# Patient Record
Sex: Male | Born: 1940 | Race: White | Hispanic: No | Marital: Married | Smoking: Current some day smoker
Health system: Southern US, Community
[De-identification: ages and names within clinical notes are randomized; demographics above are authoritative.]

## PROBLEM LIST (undated history)

## (undated) DIAGNOSIS — B9681 Helicobacter pylori [H. pylori] as the cause of diseases classified elsewhere: Secondary | ICD-10-CM

## (undated) DIAGNOSIS — K219 Gastro-esophageal reflux disease without esophagitis: Secondary | ICD-10-CM

## (undated) DIAGNOSIS — I10 Essential (primary) hypertension: Secondary | ICD-10-CM

## (undated) DIAGNOSIS — K429 Umbilical hernia without obstruction or gangrene: Principal | ICD-10-CM

## (undated) DIAGNOSIS — IMO0002 Reserved for concepts with insufficient information to code with codable children: Secondary | ICD-10-CM

## (undated) DIAGNOSIS — K648 Other hemorrhoids: Secondary | ICD-10-CM

## (undated) DIAGNOSIS — K5730 Diverticulosis of large intestine without perforation or abscess without bleeding: Secondary | ICD-10-CM

## (undated) DIAGNOSIS — R001 Bradycardia, unspecified: Secondary | ICD-10-CM

## (undated) DIAGNOSIS — E7800 Pure hypercholesterolemia, unspecified: Secondary | ICD-10-CM

## (undated) DIAGNOSIS — Z8679 Personal history of other diseases of the circulatory system: Secondary | ICD-10-CM

## (undated) DIAGNOSIS — K2970 Gastritis, unspecified, without bleeding: Secondary | ICD-10-CM

## (undated) DIAGNOSIS — Z860101 Personal history of adenomatous and serrated colon polyps: Secondary | ICD-10-CM

## (undated) DIAGNOSIS — Z86010 Personal history of colonic polyps: Secondary | ICD-10-CM

## (undated) DIAGNOSIS — K2270 Barrett's esophagus without dysplasia: Secondary | ICD-10-CM

## (undated) DIAGNOSIS — R079 Chest pain, unspecified: Secondary | ICD-10-CM

## (undated) DIAGNOSIS — K279 Peptic ulcer, site unspecified, unspecified as acute or chronic, without hemorrhage or perforation: Secondary | ICD-10-CM

## (undated) DIAGNOSIS — L719 Rosacea, unspecified: Secondary | ICD-10-CM

## (undated) DIAGNOSIS — I2510 Atherosclerotic heart disease of native coronary artery without angina pectoris: Secondary | ICD-10-CM

## (undated) DIAGNOSIS — E785 Hyperlipidemia, unspecified: Secondary | ICD-10-CM

## (undated) HISTORY — DX: Diverticulosis of large intestine without perforation or abscess without bleeding: K57.30

## (undated) HISTORY — DX: Bradycardia, unspecified: R00.1

## (undated) HISTORY — DX: Essential (primary) hypertension: I10

## (undated) HISTORY — DX: Hyperlipidemia, unspecified: E78.5

## (undated) HISTORY — DX: Helicobacter pylori (H. pylori) as the cause of diseases classified elsewhere: B96.81

## (undated) HISTORY — DX: Reserved for concepts with insufficient information to code with codable children: IMO0002

## (undated) HISTORY — PX: CAROTID STENT: SHX1301

## (undated) HISTORY — DX: Rosacea, unspecified: L71.9

## (undated) HISTORY — DX: Peptic ulcer, site unspecified, unspecified as acute or chronic, without hemorrhage or perforation: K27.9

## (undated) HISTORY — PX: COLONOSCOPY: SHX174

## (undated) HISTORY — DX: Personal history of adenomatous and serrated colon polyps: Z86.0101

## (undated) HISTORY — DX: Umbilical hernia without obstruction or gangrene: K42.9

## (undated) HISTORY — DX: Personal history of other diseases of the circulatory system: Z86.79

## (undated) HISTORY — DX: Pure hypercholesterolemia, unspecified: E78.00

## (undated) HISTORY — DX: Other hemorrhoids: K64.8

## (undated) HISTORY — PX: ANAL FISTULECTOMY: SHX1139

## (undated) HISTORY — DX: Gastro-esophageal reflux disease without esophagitis: K21.9

## (undated) HISTORY — DX: Gastritis, unspecified, without bleeding: K29.70

## (undated) HISTORY — DX: Chest pain, unspecified: R07.9

## (undated) HISTORY — DX: Barrett's esophagus without dysplasia: K22.70

## (undated) HISTORY — DX: Personal history of colonic polyps: Z86.010

---

## 1995-05-22 HISTORY — PX: KNEE ARTHROSCOPY: SHX127

## 1998-08-02 ENCOUNTER — Emergency Department (HOSPITAL_COMMUNITY): Admission: EM | Admit: 1998-08-02 | Discharge: 1998-08-02 | Payer: Self-pay | Admitting: Emergency Medicine

## 1998-08-02 ENCOUNTER — Encounter: Payer: Self-pay | Admitting: Emergency Medicine

## 1998-08-03 ENCOUNTER — Emergency Department (HOSPITAL_COMMUNITY): Admission: EM | Admit: 1998-08-03 | Discharge: 1998-08-03 | Payer: Self-pay | Admitting: Emergency Medicine

## 1999-06-27 ENCOUNTER — Encounter (INDEPENDENT_AMBULATORY_CARE_PROVIDER_SITE_OTHER): Payer: Self-pay

## 1999-06-27 ENCOUNTER — Ambulatory Visit (HOSPITAL_COMMUNITY): Admission: RE | Admit: 1999-06-27 | Discharge: 1999-06-27 | Payer: Self-pay | Admitting: *Deleted

## 1999-09-19 HISTORY — PX: MOLE REMOVAL: SHX2046

## 1999-09-23 DIAGNOSIS — E785 Hyperlipidemia, unspecified: Secondary | ICD-10-CM

## 1999-09-23 HISTORY — DX: Hyperlipidemia, unspecified: E78.5

## 2002-12-14 ENCOUNTER — Ambulatory Visit (HOSPITAL_COMMUNITY): Admission: RE | Admit: 2002-12-14 | Discharge: 2002-12-14 | Payer: Self-pay | Admitting: *Deleted

## 2002-12-14 ENCOUNTER — Encounter (INDEPENDENT_AMBULATORY_CARE_PROVIDER_SITE_OTHER): Payer: Self-pay

## 2004-11-15 ENCOUNTER — Emergency Department (HOSPITAL_COMMUNITY): Admission: EM | Admit: 2004-11-15 | Discharge: 2004-11-15 | Payer: Self-pay | Admitting: Emergency Medicine

## 2004-12-14 HISTORY — PX: CARDIAC CATHETERIZATION: SHX172

## 2004-12-19 ENCOUNTER — Ambulatory Visit (HOSPITAL_COMMUNITY): Admission: RE | Admit: 2004-12-19 | Discharge: 2004-12-20 | Payer: Self-pay | Admitting: Cardiology

## 2004-12-19 HISTORY — PX: CORONARY ANGIOPLASTY WITH STENT PLACEMENT: SHX49

## 2006-01-30 ENCOUNTER — Emergency Department (HOSPITAL_COMMUNITY): Admission: EM | Admit: 2006-01-30 | Discharge: 2006-01-30 | Payer: Self-pay | Admitting: Emergency Medicine

## 2006-01-31 ENCOUNTER — Ambulatory Visit (HOSPITAL_COMMUNITY): Admission: RE | Admit: 2006-01-31 | Discharge: 2006-01-31 | Payer: Self-pay | Admitting: Emergency Medicine

## 2006-02-04 ENCOUNTER — Encounter: Admission: RE | Admit: 2006-02-04 | Discharge: 2006-02-04 | Payer: Self-pay | Admitting: Internal Medicine

## 2006-02-27 ENCOUNTER — Encounter (INDEPENDENT_AMBULATORY_CARE_PROVIDER_SITE_OTHER): Payer: Self-pay | Admitting: *Deleted

## 2006-02-27 ENCOUNTER — Ambulatory Visit (HOSPITAL_COMMUNITY): Admission: RE | Admit: 2006-02-27 | Discharge: 2006-02-27 | Payer: Self-pay | Admitting: *Deleted

## 2006-05-21 DIAGNOSIS — IMO0002 Reserved for concepts with insufficient information to code with codable children: Secondary | ICD-10-CM

## 2006-05-21 HISTORY — DX: Reserved for concepts with insufficient information to code with codable children: IMO0002

## 2006-05-28 ENCOUNTER — Ambulatory Visit (HOSPITAL_COMMUNITY): Admission: RE | Admit: 2006-05-28 | Discharge: 2006-05-28 | Payer: Self-pay | Admitting: *Deleted

## 2006-05-28 ENCOUNTER — Encounter (INDEPENDENT_AMBULATORY_CARE_PROVIDER_SITE_OTHER): Payer: Self-pay | Admitting: Specialist

## 2007-08-26 ENCOUNTER — Encounter (INDEPENDENT_AMBULATORY_CARE_PROVIDER_SITE_OTHER): Payer: Self-pay | Admitting: *Deleted

## 2007-08-26 ENCOUNTER — Ambulatory Visit (HOSPITAL_COMMUNITY): Admission: RE | Admit: 2007-08-26 | Discharge: 2007-08-26 | Payer: Self-pay | Admitting: *Deleted

## 2008-04-09 IMAGING — CT CT PELVIS W/O CM
2 of 4 series · 14 of 32 positions shown, 19 images · IV contrast (agent unspecified)
Comparison: No prior CT.  This exam is correlated with an ultrasound on 01/31/06.

CLINICAL DATA: Back pain/recent ultrasound showed possible right hydronephrosis and multiple renal cysts. 
 ABDOMEN CT WITHOUT CONTRAST:
TECHNIQUE: Multidetector CT imaging of the abdomen was performed following the standard protocol without oral or IV contrast.
TECHNIQUE: Multidetector CT imaging of the pelvis was performed following the standard protocol without IV contrast.

[Series 2: renal stone · axial · 0.70mm/px · z∈[-351,-86]mm · 6 of 75 slices shown, 11 images]
[im 11/75  soft-tissue]
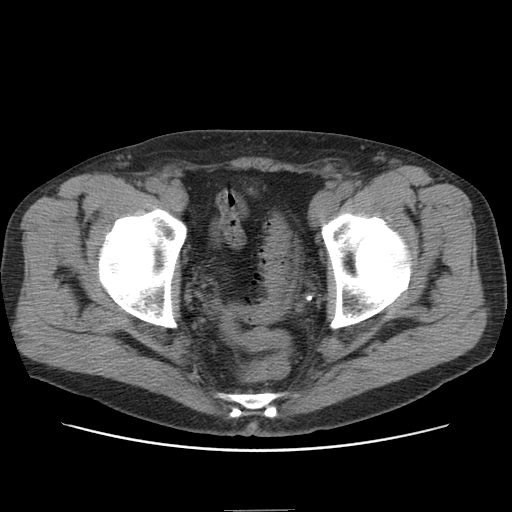
[im 11/75  bone]
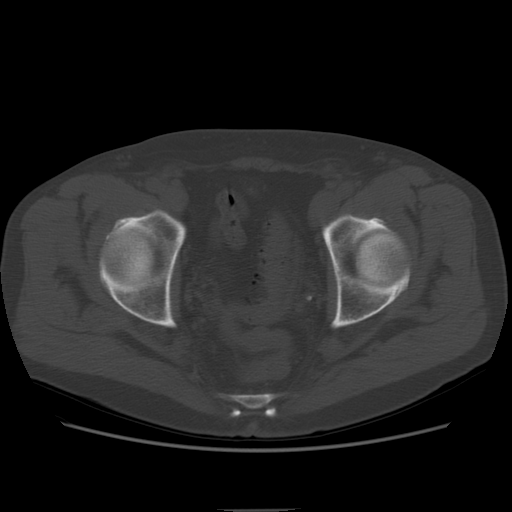
[im 22/75  soft-tissue]
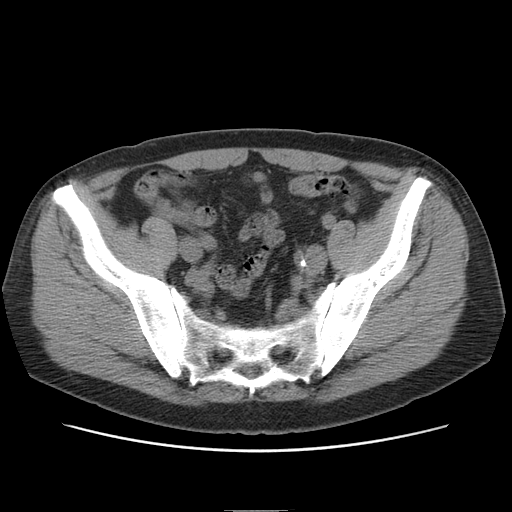
[im 32/75  soft-tissue]
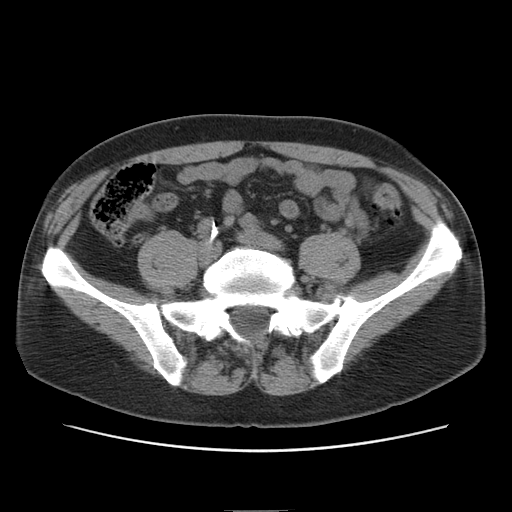
[im 32/75  lung]
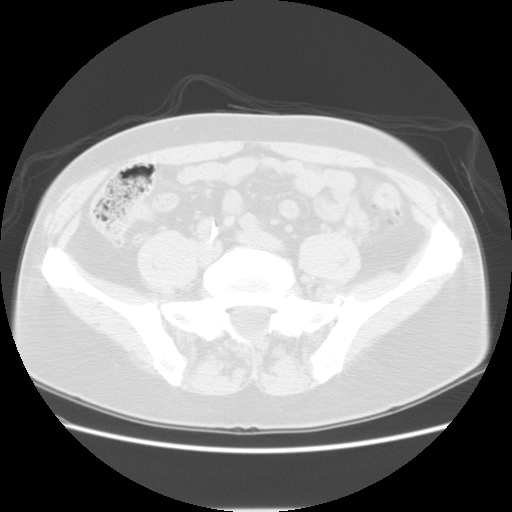
[im 43/75  soft-tissue]
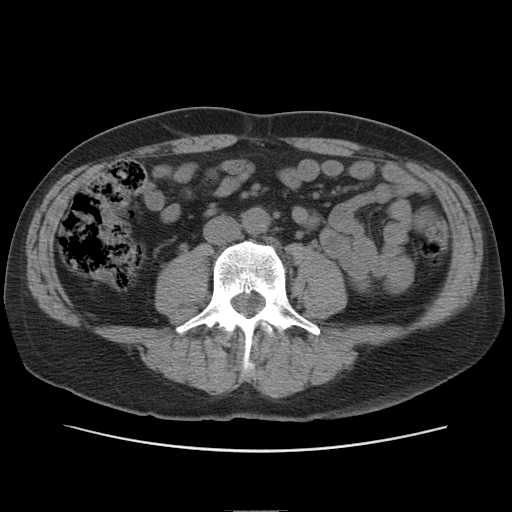
[im 43/75  lung]
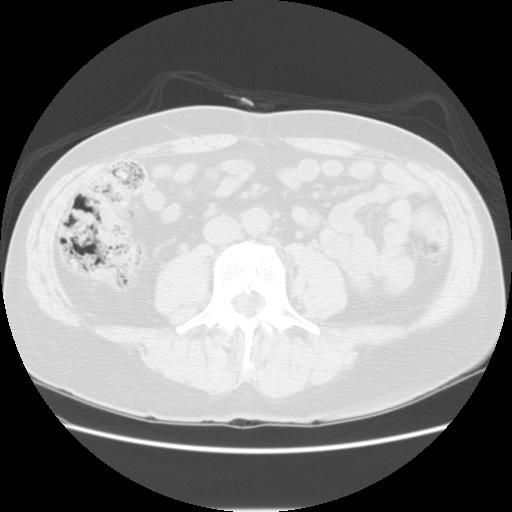
[im 53/75  soft-tissue]
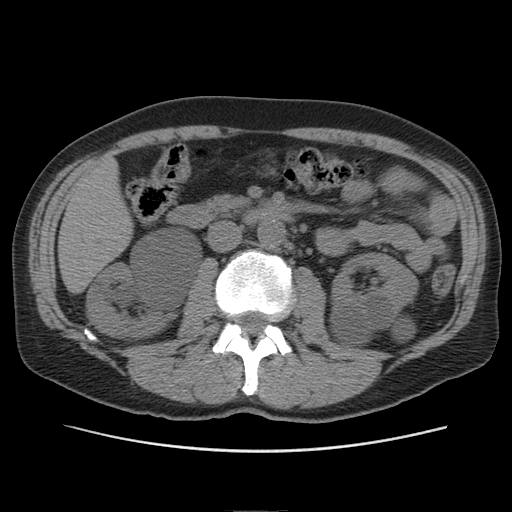
[im 53/75  lung]
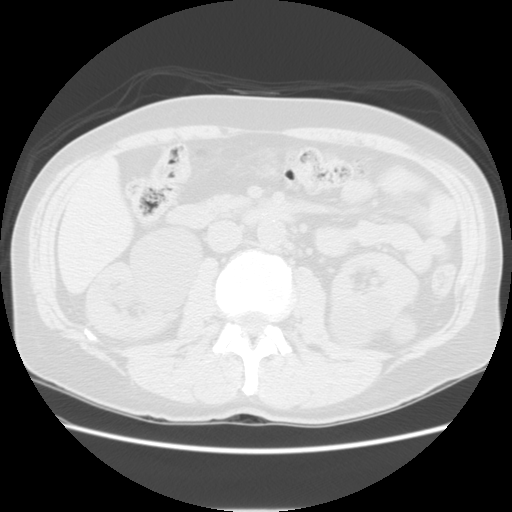
[im 64/75  soft-tissue]
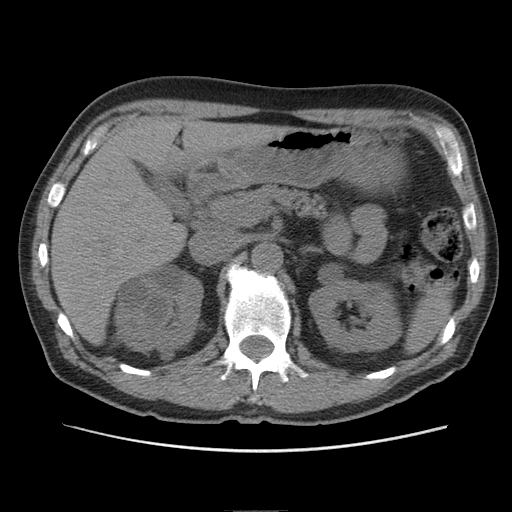
[im 64/75  lung]
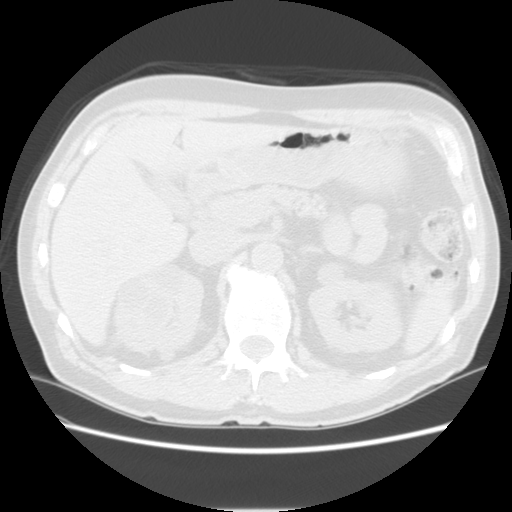

[Series 103: reformatted · sagittal · 0.74mm/px · 8 of 129 slices shown]
[im 10/129  soft-tissue]
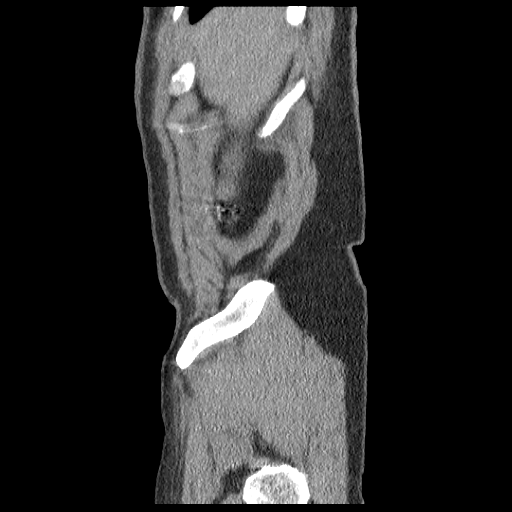
[im 30/129  soft-tissue]
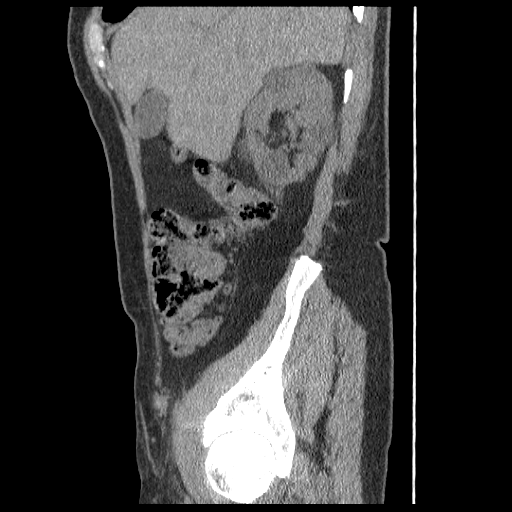
[im 40/129  soft-tissue]
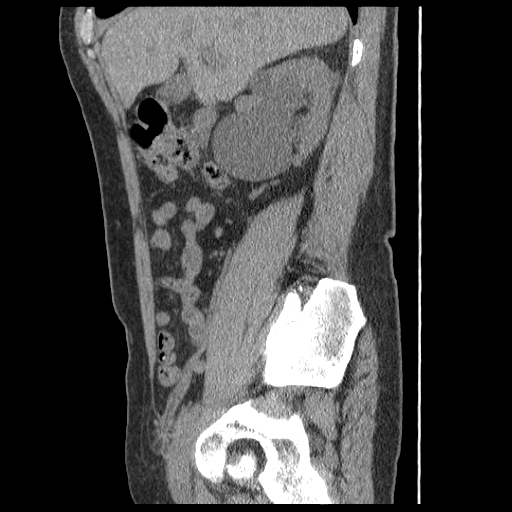
[im 60/129  soft-tissue]
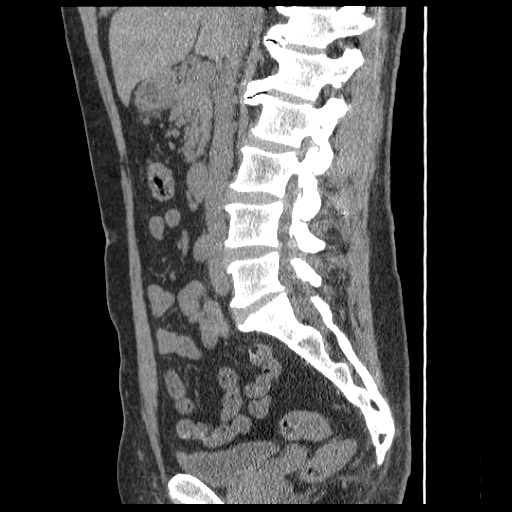
[im 69/129  soft-tissue]
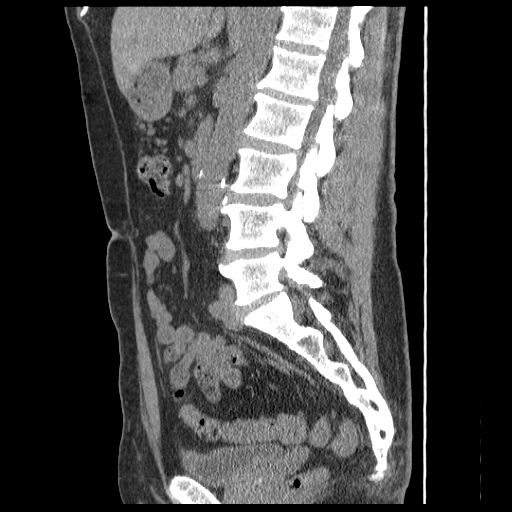
[im 89/129  soft-tissue]
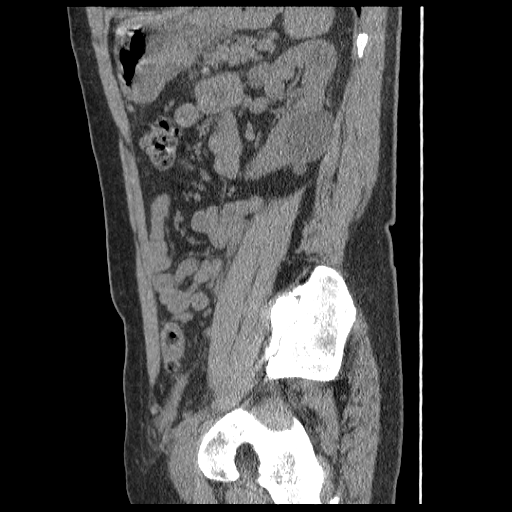
[im 99/129  soft-tissue]
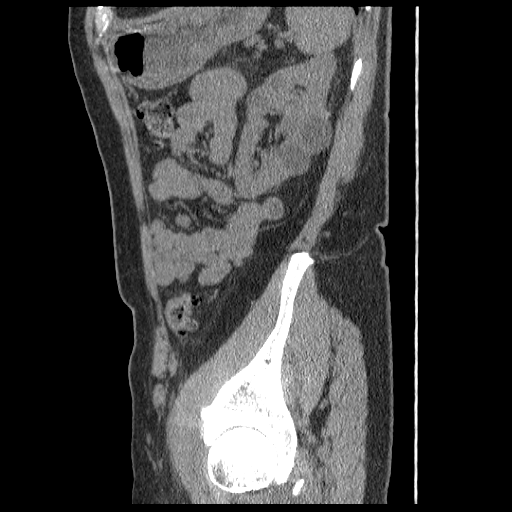
[im 119/129  soft-tissue]
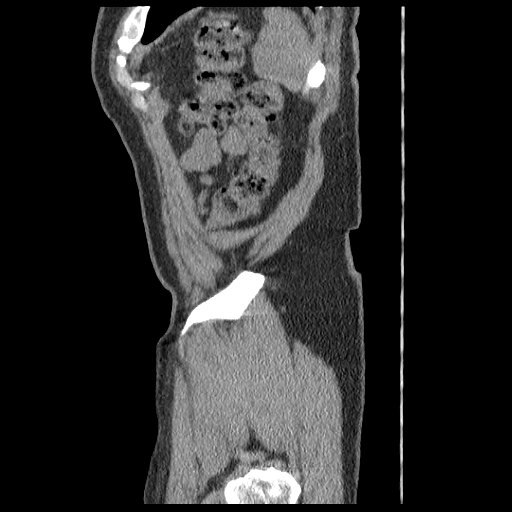

[14 of 32 positions shown; findings below may reference images not displayed]

FINDINGS: As was noted on the ultrasound, there are scattered small liver cysts. 
 There are multiple bilateral renal cysts, most of which are at or near water in density.  The 4.8 cm cyst in the lower pole of the left kidney which looked somewhat complex on ultrasound, has Hounsfield units of 5, indicating that it is a simple cyst.  There is a single small hyperdense cyst in the periphery of the lower pole of the left kidney which is benign. 
 Other viscera unremarkable in the unenhanced state.  No calcified gallstones.  There is aortic calcification without aneurysm.
IMPRESSION: 1.  Multiple bilateral renal cysts.  Favor large peripelvic right renal cyst versus dilated extrarenal pelvis due to congenital UPJ obstruction.  See note.
 2.  Multiple small liver cysts.
 3.  No other findings of significance except for some aortic calcification.
 PELVIS CT WITHOUT CONTRAST:
FINDINGS: No focal masses, adenopathy or fluid collections.  The prostate is enlarged and there is sigmoid diverticulosis. 
 No ureteral stones or dilatation.
IMPRESSION: Sigmoid diverticulosis and prostatic enlargement.  No acute findings.

## 2008-06-17 ENCOUNTER — Ambulatory Visit (HOSPITAL_COMMUNITY): Admission: RE | Admit: 2008-06-17 | Discharge: 2008-06-17 | Payer: Self-pay | Admitting: *Deleted

## 2008-09-22 ENCOUNTER — Encounter: Admission: RE | Admit: 2008-09-22 | Discharge: 2008-09-22 | Payer: Self-pay | Admitting: Cardiovascular Disease

## 2008-09-23 ENCOUNTER — Inpatient Hospital Stay (HOSPITAL_COMMUNITY): Admission: AD | Admit: 2008-09-23 | Discharge: 2008-09-24 | Payer: Self-pay | Admitting: Cardiovascular Disease

## 2008-09-23 HISTORY — PX: CARDIAC CATHETERIZATION: SHX172

## 2008-09-23 HISTORY — PX: CORONARY ANGIOPLASTY WITH STENT PLACEMENT: SHX49

## 2010-08-29 LAB — CBC
MCHC: 34.9 g/dL (ref 30.0–36.0)
MCV: 99.3 fL (ref 78.0–100.0)
Platelets: 175 10*3/uL (ref 150–400)

## 2010-08-29 LAB — CARDIAC PANEL(CRET KIN+CKTOT+MB+TROPI)
CK, MB: 1.3 ng/mL (ref 0.3–4.0)
Relative Index: INVALID (ref 0.0–2.5)
Relative Index: INVALID (ref 0.0–2.5)
Total CK: 33 U/L (ref 7–232)
Troponin I: 0.03 ng/mL (ref 0.00–0.06)
Troponin I: 0.03 ng/mL (ref 0.00–0.06)

## 2010-08-29 LAB — BASIC METABOLIC PANEL
Calcium: 9.1 mg/dL (ref 8.4–10.5)
Chloride: 111 mEq/L (ref 96–112)
Creatinine, Ser: 1.02 mg/dL (ref 0.4–1.5)
GFR calc Af Amer: >60 mL/min (ref >60)
Sodium: 143 mEq/L (ref 135–145)

## 2010-09-25 ENCOUNTER — Other Ambulatory Visit: Payer: Self-pay | Admitting: Gastroenterology

## 2010-10-03 NOTE — Discharge Summary (Signed)
NAME:  Jonathan Bullock, Jonathan Bullock NO.:  [id number]   MEDICAL RECORD NO.:  [id number]          PATIENT TYPE:  INP   LOCATION:  2504                         FACILITY:  [facility]   PHYSICIAN:  Nanetta Batty, M.D.   DATE OF BIRTH:  24-Aug-1940   DATE OF ADMISSION:  09/23/2008  DATE OF DISCHARGE:  09/24/2008                               DISCHARGE SUMMARY   DISCHARGE DIAGNOSES:  1. Chest pain consistent with unstable angina.  2. Known coronary artery disease with prior left anterior descending      stenting.  3. Treated hypertension.  4. Treated dyslipidemia.  5. Abnormal Myoview as an outpatient.   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  He was seen by Dr. Allyson Sabal on Sep 22, 2008 after an  abnormal Myoview as an outpatient that was ordered by his primary MD.  He does have a history of prior coronary artery disease and had had  previous LAD stents placed in 2006.  He was having chest pain consistent  with angina.  Please see Dr. Hazle Coca note for complete details.  The  patient was admitted for diagnostic catheterization on Sep 23, 2008.  He  had a patent proximal LAD stent, but a 95% LAD lesion just after the  stent.  This was dilated and stented with his IM stent.  Left main  circumflex and RCA within normal limits and his EF was normal.  Dr.  Allyson Sabal feels he can be discharged on Sep 24, 2008.   LABS AT DISCHARGE:  White count 5.8, hemoglobin 14.1, hematocrit 40.5,  platelets 175.  Sodium 143, potassium 4.2, BUN 11, creatinine 1,  troponins were negative x2.   DISCHARGE MEDICATIONS:  1. Acebutolol 200 mg b.i.d.  2. Crestor 10 mg at bedtime.  3. Plavix 75 mg a day.  4. Aspirin 81 mg a day.  5. Ranitidine 150 mg a day.  6. Isosorbide 60 mg a day.  7. Nitroglycerin sublingual p.r.n.   DISPOSITION:  The patient is discharged in stable condition.  Dr. Allyson Sabal  will see him next week in the office.     Abelino Derrick, P.A.      Nanetta Batty, M.D.  Electronically Signed   LKK/MEDQ  D:  09/24/2008  T:  09/24/2008  Job:  [id number]

## 2010-10-03 NOTE — Op Note (Signed)
NAME:  Jonathan Bullock, Jonathan Bullock NO.:  [id number]   MEDICAL RECORD NO.:  [id number]          PATIENT TYPE:  AMB   LOCATION:  ENDO                         FACILITY:  [facility]   PHYSICIAN:  Georgiana Spinner, M.D.    DATE OF BIRTH:  1940/08/04   DATE OF PROCEDURE:  DATE OF DISCHARGE:                               OPERATIVE REPORT   PROCEDURE:  Upper endoscopy.   INDICATIONS:  GERD.   ANESTHESIA:  Demerol 70 mg, Versed 7.5 mg.   PROCEDURE:  With the patient mildly sedated in the left lateral  decubitus position, the Pentax videoscopic endoscope was inserted in the  mouth and passed under direct vision through the esophagus, which  appeared normal until we reached the distal esophagus and there appeared  to be small areas of Barrett's photographed and biopsied.  We entered  into the stomach.  Fundus, body, antrum, duodenal bulb, and second  portion of the duodenum were visualized.  From this point the endoscope  was slowly withdrawn, taking circumferential views of duodenal mucosa  until the endoscope been pulled back into stomach, placed in  retroflexion to view the stomach from below.  The endoscope was  straightened and withdrawn, taking circumferential views of remaining  gastric and esophageal mucosa.  The patient's vital signs, pulse  oximeter remained stable.  The patient tolerated the procedure well  without apparent complications.   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  Await biopsy  reports.  The patient will call me for results and follow up with me as  needed as an outpatient.           ______________________________  Georgiana Spinner, M.D.     GMO/MEDQ  D:  08/26/2007  T:  08/26/2007  Job:  [id number]

## 2010-10-03 NOTE — Cardiovascular Report (Signed)
NAME:  RICE, WALSH NO.:  [id number]   MEDICAL RECORD NO.:  [id number]          PATIENT TYPE:  INP   LOCATION:  2504                         FACILITY:  [facility]   PHYSICIAN:  Nanetta Batty, M.D.   DATE OF BIRTH:  [birth year]   DATE OF PROCEDURE:  09/23/2008  DATE OF DISCHARGE:                            CARDIAC CATHETERIZATION   Cardiac catheterization/PCI and stent.   Mr. Popowski is a 70 year old married white male father of 2, grandfather  to 4 grandchildren, who works as a Clinical research associate in the firm [employer].  He has known CAD status post stenting of his mid LAD by Dr. Alanda Amass,  December 19, 2004, with overlapping of Cypher drug-eluting stents.  His  other problems include hypertension and hyperlipidemia.  He has had  recent chest pain, and a Myoview performed in our office yesterday  showed inferoapical ischemia.  He presents now for diagnostic coronary  arteriography to define his anatomy and rule out an ischemic etiology.   PROCEDURE DESCRIPTION:  The patient was brought to the second floor of   Cardiac Cath Lab in the postabsorptive state.  He was  premedicated with p.o. Valium, IV Versed and fentanyl.  His right groin  was prepped and shaved in the usual sterile fashion.  Xylocaine 1% was  used for local anesthesia.  A 6-French sheath was inserted into the  right femoral artery using standard Seldinger technique.  A 6-French  right and left Judkins diagnostic catheter as well as a 6-French pigtail  catheter were used for selective coronary angiography, left  ventriculography respectively.  Visipaque dye was used for the entirety  of the case.  Retrograde aortic, left ventricular, and pullback  pressures were recorded.   HEMODYNAMICS:  1. Aortic systolic pressure 121, diastolic pressure 60.  2. Left ventricular systolic pressure 118, end-diastolic pressure 3.  3. Selective cholangiography:      a.     Left main normal.      b.     LAD; the LAD  stents were patent.  It was a focal 95% lesion       just distal to the distal stent in the proximal LAD.      c.     Circumflex; nondominant and free of significant disease.      d.     Right coronary; dominant and free of significant disease.      e.     Left ventriculography; RAO and left ventriculogram was       performed using 25 mL of Visipaque dye at 12 mL per second.  The       overall LVEF was estimated greater than 60% without focal wall       motion abnormalities.   IMPRESSION:  Mr. Eliot has new lesion just distal to the previously  placed stent in the mid left anterior descending responsible for his  symptoms and Myoview abnormality.  We will proceed with percutaneous  coronary intervention and stenting using Angiomax and drug-eluting  stent.   The patient received 4 baby aspirin, an additional 300 mg of p.o.  Plavix.  He was on a 75 mg a day maintenance dose and did take his  morning dose this morning.  He received Angiomax bolus with an ACT over  300.   Using a 6-French LAD XB 3.5 catheter along with 0.014, 190 Asahi soft  wire and a 2.0 x 12  Apex, angioplasty was performed at nominal  pressures across the lesion.  Intracoronary nitroglycerin 200 mcg was  then administered.  A 3.0 x 12 XIENCE V drug-eluting stent was then  deployed at 12 atmospheres and postdilated with a 3.25 x 15 Milnor Quantum  Apex at 16 atmospheres (3.3 mm), resulting in reduction of a 95% mid LAD  lesion to 0% residual without dissection.  The patient tolerated the  procedure well without hemodynamic or electrocardiographic sequelae.  Successful PCI and stenting of mid LAD using XIENCE V drug-eluting stent  and Angiomax.  The guidewire and catheter were removed.  The sheath was  sewn securely in place.  The patient left the lab in stable condition.  He will be hydrated for 6 hours, discharged home in the morning on  aspirin and Plavix and will see Dr. Charolette Child back in the office in   followup.       Nanetta Batty, M.D.  Electronically Signed     JB/MEDQ  D:  09/23/2008  T:  09/24/2008  Job:  [id number]   cc:   [facility].  Antionette Char, MD

## 2010-10-03 NOTE — Op Note (Signed)
NAME:  JEVAUN, STRICK NO.:  [id number]   MEDICAL RECORD NO.:  [id number]          PATIENT TYPE:  AMB   LOCATION:  ENDO                         [facility]ACILITY:  [facility]   PHYSICIAN:  Georgiana Spinner, M.D.    DATE OF BIRTH:  1940-12-21   DATE OF PROCEDURE:  DATE OF DISCHARGE:                               OPERATIVE REPORT   PROCEDURE:  Colonoscopy.   INDICATIONS:  Colon cancer screening.   ANESTHESIA:  Demerol 70 mg, Versed 7.5 mg.   DESCRIPTION OF PROCEDURE:  With the patient mildly sedated in the left  lateral decubitus position a rectal exam was performed which was  unremarkable.  Subsequently, the Pentax videoscopic pediatric  colonoscope was inserted in the rectum and passed under direct vision to  the cecum, identified by ileocecal valve and appendiceal orifice, both  of which were photographed.  From this point, the colonoscope was slowly  withdrawn taking circumferential views of colonic mucosa, stopping in  the rectum which appeared normal on direct and showed hemorrhoids on  retroflexed view.  The endoscope was straightened and withdrawn.  The  patient's vital signs and pulse oximeter remained stable.  The patient  tolerated the procedure well without apparent complications.   FINDINGS:  Diverticulosis of sigmoid colon, mild internal hemorrhoids,  otherwise unremarkable exam.   PLAN:  Consider repeat examination in 5-10 years.           ______________________________  Georgiana Spinner, M.D.     GMO/MEDQ  D:  06/17/2008  T:  06/17/2008  Job:  [id number]

## 2010-10-06 NOTE — Cardiovascular Report (Signed)
NAME:  [name], [name] NO.:  [id number]   MEDICAL RECORD NO.:  [id number]          PATIENT TYPE:  OIB   LOCATION:  6529                         FACILITY:  [facility]   PHYSICIAN:  Richard A. Alanda Amass, M.D.DATE OF BIRTH:  [birth year]   DATE OF PROCEDURE:  12/19/2004  DATE OF DISCHARGE:                              CARDIAC CATHETERIZATION   PROCEDURES:  Retrograde central aortic catheterization, selective left  coronary angiography pre and post IC nitroglycerin administration, weight  adjusted heparin, Aggrastat bolus plus infusion, double wire LAD-DX2, PCI  with cutting balloon atherectomy ostium DX2, and DES 3.5/13 Cypher stenting  proximal LAD across DX2, tandem 3.0/13 mid LAD DES stent post dilated with  3.45 balloon, DX2 2.5 ostial balloon POBA through stent struts, Aggrastat  bolus plus infusion, additional continue aspirin, Plavix 300 milligrams  p.o., weight-adjusted heparin, IC nitroglycerin administration, pre and post  LAD PCI IVUS interrogation LAD.   BRIEF HISTORY:  Mr. Jonathan Bullock is a married 70 year old father of two  with one grandchild noncigarette smoker, occasional cigars, practicing real  estate lawyer, under the care of Dr. Renne Crigler and Dr. Aleen Campi. He had episode  of ischemic chest pain recently prompting outpatient treadmill exercise  which was positive for ischemia (December 07, 2004). Outpatient catheterization  by Dr. Aleen Campi, December 14, 2004, revealed high-grade LAD stenosis across the  second diagonal, ostial DX to stenosis and tandem proximal-mid LAD stenosis.  Well-preserved LV function. No significant right or circumflex disease. He  was referred for PCI in this setting.   PROCEDURE:  Risks were fully explained to the patient and his family and  informed consent was obtained to proceed with angiography and probable PCI.   He was taken to the second floor CP lab in the postabsorptive state after  all premedication. He was given 300 milligrams  of Plavix prior to the  procedure and additional 150 milligrams during the procedure. Weight-  adjusted heparin and Aggrastat bolus plus infusion were administered. The  right groin was prepped, draped in the usual manner. 1% Xylocaine was used  for local anesthesia and CRFA was entered with single anterior puncture  using an 18 thin-wall needle. A 7-French short Daig sidearm sheath was  inserted anticipating the possible need for a 7-French catheter because of  double wire and possibly kissing balloon technique. The left coronary was  intubated with a JL-4, 7-French guiding catheter. Weight-adjusted heparin  and Aggrastat bolus plus infusion was administered. A 0.014 inch Asahi soft  wire was then used to cross the tandem LAD stenosis and was free in the  distal LAD. A second 0.014 inches Asahi light wire was used to cross into  the second diagonal branch. The patient was given intermittent Versed and  fentanyl for sedation in the laboratory IV. ACTs were monitored and remained  therapeutic. An Atlantis Pro IVUS this was then passed across the LAD  lesions and pullback IVUS interrogation was performed after initial  angiography pre and post IC nitroglycerin. The stenosis appeared slightly  less than on his preoperative shots and it was felt best to confirm these  lesions as well assess his vessel sizes.   The distal reference vessel measured 3.1 x 3.5 mm with eccentric  calcification and plaque. There was approximately 60% narrowing in the area  in the second tandem lesion beyond the DX2.   The LAD lesion just proximal to and across the DX2 measured 1.9 x 2.4 mm  with significant soft plaque and eccentric calcification. The proximal  reference in the large portion of the proximal LAD measured 4.3 x 3.8 mm.  The patient tolerated IVUS interrogation well.   The diagonal wire was then crossed with a 2.5/10 cutting balloon and the  ostium was dilated at 5-43, 6-31 at 6 atmospheres for 44  seconds with good  angiographic result. This was then exchanged for 3.5/13 Cypher stent. The  LAD lesion was covered across the DX2, positioned fluoroscopically just  beyond the large portion of the proximal LAD and deployed at 10-25, post  dilated at 20 atmospheres for 56 seconds. There was pinching of the DX2  ostium. The guide wire had been pulled back prior to stent deployment and  then the DX2 was then recrossed through the stent struts with a same 0.014  inches guidewire. The ostium of the DX2 was dilated with a 2.5/08 Voyager  balloon at 5-24 and 5-36. The balloon was then pulled back. The tandem  second LAD lesion was then primarily stented with an overlapping 3.0/13  Cypher stent which was the beyond the DX2 which deployed at 20-56 and post  dilated at 18 atmospheres for 40 seconds. IVUS interrogation was then  performed and this showed slight under expansion of the second stent and  considerable soft plaque behind it. There was good expansion of the first  LAD stent. The overlap and tandem LAD stent was then dilated with a upgraded  3.5/8 Quantum Maverick balloon at 10-26 and 15 atmospheres for 30 seconds.  The balloon was pulled back and final injection showed excellent  angiographic result. The side branch DX2 was intact with stenosis reduced  from 75% to 0%.   The LAD stenosis proximal across the DX2 was reduced from 75% 0%. The tandem  second LAD lesion beyond the DX2 was reduced from 60 to 0%. There was TIMI  III flow throughout the LAD diagonal. The diagonal ostium was reduced from  approximately 85% to less than 40% with some residual ostial narrowing but  good flow.   Dilatation system was removed. Side-arm sheath was flushed and secured to  the skin with #1 silk suture to prevent migration. The patient was  transferred to holding area for postoperative care. He will be continued on  Aggrastat for 18 hours, continued on aspirin and Plavix. He will be under the  cardiology care of Dr. Charolette Child and medical care of Dr. Merri Brunette.   CATHETER DIAGNOSES:  1.  Arteriosclerotic heart disease, recent onset angina.  2.  Positive treadmill exercise test.  3.  Successful percutaneous coronary intervention.      1.  Drug-eluting stent proximal-mid left anterior descending artery          across distal circumflex-2.      2.  Tandem drug-eluting stenting midleft anterior descending artery          beyond the distal circumflex-2.      3.  Ostial distal circumflex-2 cutting balloon atherectomy and upgraded          2.5 balloon dilatation successful.  4.  Hyperlipidemia.  5.  Nicotine abuse, cigars.  6.  Active rosacea.  7.  Hyperlipidemia.  8.  Systemic hypertension.      Richard A. Alanda Amass, M.D.  Electronically Signed     RAW/MEDQ  D:  12/19/2004  T:  12/19/2004  Job:  [id number]   cc:   Patient's chart   Antionette Char, MD  [address]  [place]  [place] [address]  Fax: [phone]   Soyla Murphy. Renne Crigler, M.D.  [address]  [place]  [place] [address]  Fax: [phone]

## 2010-10-06 NOTE — Op Note (Signed)
NAME:  Jonathan Bullock, HERMIZ NO.:  [id number]   MEDICAL RECORD NO.:  [id number]          PATIENT TYPE:  AMB   LOCATION:  ENDO                         FACILITY:  [facility]   PHYSICIAN:  Georgiana Spinner, M.D.    DATE OF BIRTH:  01-26-1941   DATE OF PROCEDURE:  02/27/2006  DATE OF DISCHARGE:                                 OPERATIVE REPORT   PROCEDURE:  Upper endoscopy.   INDICATIONS:  Abdominal pain with radiation to the back.   ANESTHESIA:  1. Demerol 70 mg.  2. Versed 7 mg.   PROCEDURE:  With the patient mildly sedated in the left lateral decubitus  position, the Olympus videoscopic endoscope was inserted in the mouth,  passed under direct vision into the esophagus which appeared normal.  There  was no evidence of Barrett's esophagus.  Small hiatal hernia was noted and  photographed.  We entered into the stomach.  Fundus, body, antrum, duodenal  bulb, second portion of duodenum were visualized.  From this point, the  endoscope was slowly withdrawn, taking circumferential views of duodenal  mucosa until the endoscope had been pulled back into the stomach, placed in  retroflexion to view the stomach from below.  The endoscope was then  straightened and withdrawn taking circumferential views of the remaining  gastric and esophageal mucosa stopping to photograph along the way and then  stopping and as we viewed the posterior wall of the stomach and the fundus  approximately 10 cm from the squamocolumnar junction at which point a heaped  up with an ulcer in it was seen.  It was photographed, and it was biopsied  with multiple large bites taken around the ulcer base.  We also then moved  back to the body of the stomach where some mild erythema was seen and  biopsied to sample for H. pylori.  Once accomplished, the endoscope was  withdrawn, taking circumferential views of the remaining gastric and  esophageal mucosa.  The patient's vital signs, pulse oximeter remained  stable.   The patient tolerated the procedure well without apparent  complications.   FINDINGS:  Ulcer in a large heaped up area posteriorly in the fundus 10 cm  from the squamocolumnar junction and question of gastritis.  Biopsies of  both areas taken.   PLAN:  Await biopsy report.  The patient will call me for results and follow  up with me as an outpatient.  Continue present therapy.           ______________________________  Georgiana Spinner, M.D.     GMO/MEDQ  D:  02/27/2006  T:  02/28/2006  Job:  [id number]

## 2010-10-06 NOTE — Op Note (Signed)
   NAME:  Jonathan Bullock, Jonathan Bullock                           ACCOUNT NO.:  [id number]   MEDICAL RECORD NO.:  [id number]                   PATIENT TYPE:  AMB   LOCATION:  ENDO                                 FACILITY:  [facility]   PHYSICIAN:  Georgiana Spinner, M.D.                 DATE OF BIRTH:  1941/02/09   DATE OF PROCEDURE:  DATE OF DISCHARGE:                                 OPERATIVE REPORT   PROCEDURE:  Colonoscopy with follow up polypectomy and biopsy.   ENDOSCOPIST:  Georgiana Spinner, M.D.   INDICATIONS:  Rectal bleeding.   ANESTHESIA:  Demerol 75, Versed 8 mg.   DESCRIPTION OF PROCEDURE:  With the patient mildly sedated in the left  lateral decubitus position, the Olympus videoscopic coloscope was inserted  into the rectum and passed under direct vision into the cecum, identified by  ileocecal valve and appendiceal orifice both of which were photographed.   From this point the colonoscope was slowly withdrawn taking circumferential  views of the entire colonic mucosa stopping only to photograph seen along  the way until we reached the rectum which appeared normal on direct view;  and showed 2 small polyps on retroflex view, both of which were removed.  One using snare cautery technique, the other with hot biopsy forceps  technique.  The tissues was retrieved for pathology.  The patient's vital  signs and pulse oximetry remained stable.  The patient tolerated the  procedure well without apparent complications.   FINDINGS:  Polyps of the rectum seen only on retroflex view with mild  diverticulosis of the sigmoid colon, otherwise unremarkable colonoscopic  examination.   PLAN:  Some of the tissue from the slightly larger polyp was lost due to  cautery and artifact; therefore, will repeat examination in 3 years  regardless of results of biopsy report because the family history is  positive in this patient.                                               Georgiana Spinner, M.D.    GMO/MEDQ  D:   12/14/2002  T:  12/14/2002  Job:  [id number]

## 2010-10-06 NOTE — Op Note (Signed)
NAME:  Jonathan Bullock, Jonathan Bullock NO.:  [id number]   MEDICAL RECORD NO.:  [id number]          PATIENT TYPE:  AMB   LOCATION:  ENDO                         FACILITY:  [facility]   PHYSICIAN:  Georgiana Spinner, M.D.    DATE OF BIRTH:  14-Apr-1941   DATE OF PROCEDURE:  05/28/2006  DATE OF DISCHARGE:                               OPERATIVE REPORT   PROCEDURE:  Upper endoscopy.   INDICATIONS:  Follow-up of gastric ulcer.   ANESTHESIA:  Fentanyl 60 mcg and Versed 6 mg.   PROCEDURE:  With the patient mildly sedated in the left lateral  decubitus position, the Pentax videoscopic endoscope was inserted in the  mouth and passed under direct vision through the esophagus, which  appeared normal until we reached the distal esophagus and there was a  question of Barrett's seen at this time.  I photographed this area and  took biopsy of this area, entered into the stomach subsequently.  Fundus, body and antrum appeared normal.  There was no evidence of  previously noted ulcer.  Duodenal bulb and second portion duodenum  appeared normal.  From this point, the endoscope was slowly withdrawn  taking circumferential views of duodenal mucosa until the endoscope had  been pulled back into the stomach and placed in retroflexion to view the  stomach from below.  The endoscope was straightened and withdrawn taking  circumferential views of the remaining gastric and esophageal mucosa  stopping to biopsy antral mucosa to verify the H. pylori eradication.  The patient's vital signs and pulse oximeter remained stable.  The  patient tolerated the procedure well without apparent complication.   FINDINGS:  Question of Barrett's esophagus but otherwise an unremarkable  examination with biopsies taken of the distal esophagus and of the  antrum, as noted above.   PLAN:  Await biopsy report.  The patient will call me for results and  follow-up with me as needed.           ______________________________  Georgiana Spinner, M.D.     GMO/MEDQ  D:  05/28/2006  T:  05/28/2006  Job:  [id number]

## 2010-10-06 NOTE — H&P (Signed)
NAMEMarland Kitchen  NAVEEN, CLARDY NO.:  [id number]   MEDICAL RECORD NO.:  [id number]          PATIENT TYPE:  OIB   LOCATION:  2899                         FACILITY:  [facility]   PHYSICIAN:  Richard A. Alanda Amass, M.D.DATE OF BIRTH:  Oct 04, 1940   DATE OF ADMISSION:  12/19/2004  DATE OF DISCHARGE:                                HISTORY & PHYSICAL   REASON FOR ADMISSION:  Intervention to the LAD.   HISTORY OF PRESENT ILLNESS:  A 70 year old white married male with history  of chest pain the 1st of July, felt like he swallowed a tablespoon of peanut  butter and was stuck in his esophagus.  The pain/pressure went from his mid  sternum to his back, back and forth.  Lasted about an hour, hour and a half.  He drove himself to the emergency room.  Pain stopped before he entered but  he did feel the need to go ahead and be evaluated.   Cardiac work-up in the ER was negative.  He then underwent ETT as an  outpatient with positive ischemia.  He did have good exercise tolerance.  Next procedure was cardiac catheterization on December 14, 2004 at [facility].  He had single vessel coronary disease with severe stenosis proximal  to mid LAD 70% involving the takeoff of the second anterolateral branch.  EF  was 60-70%.  He had mild mitral valve prolapse, normal abdominal aorta,  renals, iliac, and femoral arteries.   Secondary to the positive stress test, his chest pain, and the stenosis,  patient now presents for PCI by Dr. Susa Griffins.   Patient denies any significant chest pain since his cardiac catheterization.  Has always had pin prick type discomfort in his left anterior chest that  comes and goes.   PAST MEDICAL HISTORY:  1.  New cardiac disease.  2.  Positive tobacco.  3.  Dyslipidemia treated with Crestor.  4.  Acne rosacea.  5.  Erectile dysfunction.   PAST SURGICAL HISTORY:  1.  Fistula repair x2.  2.  Tonsillectomy.  3.  Circumcision.   ALLERGIES:  CODEINE has  caused nausea and vomiting, also HONEY BEE STINGS.   OUTPATIENT MEDICATIONS:  1.  Aspirin 81 mg daily.  2.  Vitamin C 1000 daily.  3.  Micardis 80 daily.  4.  Levitra p.r.n.  5.  Toprol XL 25 daily which patient does not like taking secondary to      erectile dysfunction.  6.  Glucosamine one daily.  7.  Multivitamin daily.  8.  Crestor questionable dose.  9.  Plavix 75 daily.   FAMILY HISTORY:  Father died 78, died in his sleep.  Mother died at 45, had  breast cancer and pheochromocytoma.   SOCIAL HISTORY:  He is married.  He is a Clinical research associate.  Smokes cigar.  Does use  alcohol socially.  Drinks three cups of tea a day.  He is right-handed and  plays golf left-handed.   REVIEW OF SYSTEMS:  GENERAL:  No fever, chills, sweats.  No changes in  weight.  Sleeps well.  Eyes:  No visual changes.  Wears glasses.  HEENT:  Some tinnitus occasionally, but no sinus infections.  CARDIOVASCULAR:  Chest  pain as described previously.  Respirations:  No dyspnea.  Still smokes  cigars.  He does snore in his sleep.  GI:  No dysphagia, nausea, vomiting,  occasional heartburn.  No diarrhea, constipation, or melena.  GU:  No  hematuria or dysuria, nocturia one to three times a night.  MUSCULOSKELETAL:  No claudication.  No myalgias or arthritic pains.  SKIN:  Without rashes,  though he does have rosacea of his face.  NEUROLOGIC:  Alert.  No syncope.  No dizziness.  No lightheadedness.  No seizures.  No CVAs.  PSYCH:  No  history of depression.  ENDOCRINE:  No diabetes or thyroid disease.  HEME:  He does have easy bleeding and if he cuts himself he does bleed.  When he  goes to [employer] he does not take his aspirin due to bleeding.  He is  afraid of cutting himself on part of the boat and causing problems.   PHYSICAL EXAMINATION:  VITAL SIGNS:  Blood pressure today 130/70, pulse 55,  respirations 20, temperature 97, oxygen saturation 98%.  GENERAL:  Alert, oriented white male in no acute distress.   SKIN:  Warm and dry.  Brisk capillary refill.  Ruddy complexion.  HEENT:  Sclerae clear, otherwise negative.  NECK:  Supple.  No JVD.  No bruits.  HEART:  S1, S2.  Regular rate and rhythm without murmur, gallop, rub, or  click.  LUNGS:  Clear without rubs, rhonchi, or wheezes.  ABDOMEN:  Soft, nontender, positive bowel sounds.  Can not palpate liver,  spleen, or masses.  EXTREMITIES:  Without edema.  2+ pedals bilaterally.  NEUROLOGIC:  Alert and oriented x3.  Moves all extremities and follows  commands.   LABORATORIES:  Hemoglobin 14.5, hematocrit 42.3, WBC 7.1, platelets 248.  Pro time 13.4, INR 1, PTT 28.  Sodium 139, potassium 4.7, chloride 107, CO2  28, glucose 113, BUN 17, creatinine 1.1, calcium 9.3.   IMPRESSION:  1.  Chest pain.  2.  Positive stress test.  3.  Single vessel coronary disease for PCI today.  4.  History of tobacco abuse.  5.  Dyslipidemia.   PLAN:  To undergo PCI with Dr. Alanda Amass today.  Needs tobacco cessation and  his dyslipidemia will be followed by Dr. Aleen Campi as well as care once the  intervention is complete.      Darcella Gasman. [place], N.P.      Richard A. Alanda Amass, M.D.  Electronically Signed    LRI/MEDQ  D:  12/19/2004  T:  12/19/2004  Job:  [id number]   cc:   Antionette Char, MD  [address]  [place]  [place] [address]  Fax: [phone]   Soyla Murphy. Renne Crigler, M.D.  [address]  [place]  [place] [address]  Fax: [phone]   Richard A. Alanda Amass, M.D.  [phone] [address]  [place]  [place] [address]  Fax: [phone]

## 2011-08-09 ENCOUNTER — Encounter (INDEPENDENT_AMBULATORY_CARE_PROVIDER_SITE_OTHER): Payer: Self-pay | Admitting: General Surgery

## 2011-08-09 ENCOUNTER — Encounter (INDEPENDENT_AMBULATORY_CARE_PROVIDER_SITE_OTHER): Payer: Self-pay | Admitting: Surgery

## 2011-08-09 ENCOUNTER — Ambulatory Visit (INDEPENDENT_AMBULATORY_CARE_PROVIDER_SITE_OTHER): Payer: MEDICARE | Admitting: Surgery

## 2011-08-09 VITALS — BP 148/92 | HR 60 | Temp 97.1°F | Resp 16 | Ht 71.0 in | Wt 194.5 lb

## 2011-08-09 DIAGNOSIS — K429 Umbilical hernia without obstruction or gangrene: Secondary | ICD-10-CM

## 2011-08-09 HISTORY — DX: Umbilical hernia without obstruction or gangrene: K42.9

## 2011-08-09 NOTE — Progress Notes (Signed)
Patient ID: Jonathan Bullock, male   DOB: [birth year], 71 y.o.   MRN: [id number]  Chief Complaint  Patient presents with  . New Evaluation    eval of umbilical hernia     HPI Jonathan Bullock is a 71 y.o. male.  Referred by Dr. Renne Crigler for evaluation of umbilical hernia HPI This is a 71 yo male who splits his time between [place] and [place] who presents with a recent onset of umbilical hernia.  The patient exercises regularly and was lifting some weights when he felt a pulling sensation at his umbilicus.  He has a developed a soft bulge in this area with minimal discomfort. It remains reducible.  His cardiologist is Dr. Allyson Sabal and he has an appointment in the next two weeks. Past Medical History  Diagnosis Date  . Hypertension   . GERD (gastroesophageal reflux disease)   . Umbilical hernia 08/09/2011    Past Surgical History  Procedure Date  . Knee arthroscopy 1998    right knee   . Anal fistulectomy 1972/1974  . Carotid stent 2007/2010    x3    Family History  Problem Relation Age of Onset  . Heart disease Mother   . Heart disease Father     Social History History  Substance Use Topics  . Smoking status: Current Some Day Smoker    Types: Cigarettes  . Smokeless tobacco: Former Neurosurgeon  . Alcohol Use: 7.0 oz/week    14 drink(s) per week    No Known Allergies  Current Outpatient Prescriptions  Medication Sig Dispense Refill  . acebutolol (SECTRAL) 200 MG capsule       . aspirin 81 MG tablet Take 81 mg by mouth daily.      . clopidogrel (PLAVIX) 75 MG tablet       . CRESTOR 20 MG tablet       . Cyanocobalamin (VITAMIN B-12 PO) Take by mouth.      Marland Kitchen GLUCOSAMINE PO Take by mouth.      . Multiple Vitamins-Minerals (MULTIVITAMIN PO) Take by mouth.      . pantoprazole (PROTONIX) 40 MG tablet         Review of Systems Review of Systems  Constitutional: Negative for fever, chills and unexpected weight change.  HENT: Negative for hearing loss, congestion, sore throat,  trouble swallowing and voice change.   Eyes: Negative for visual disturbance.  Respiratory: Negative for cough and wheezing.   Cardiovascular: Negative for chest pain, palpitations and leg swelling.  Gastrointestinal: Negative for nausea, vomiting, abdominal pain, diarrhea, constipation, blood in stool, abdominal distention, anal bleeding and rectal pain.  Genitourinary: Negative for hematuria and difficulty urinating.  Musculoskeletal: Negative for arthralgias.  Skin: Negative for rash and wound.  Neurological: Negative for seizures, syncope, weakness and headaches.  Hematological: Negative for adenopathy. Does not bruise/bleed easily.  Psychiatric/Behavioral: Negative for confusion.    Blood pressure 148/92, pulse 60, temperature 97.1 F (36.2 C), temperature source Temporal, resp. rate 16, height 5\' 11"  (1.803 m), weight 194 lb 8 oz (88.225 kg).  Physical Exam Physical Exam WDWN in NAD HEENT:  EOMI, sclera anicteric Neck:  No masses, no thyromegaly Lungs:  CTA bilaterally; normal respiratory effort CV:  Regular rate and rhythm; no murmurs Abd:  +bowel sounds, soft, non-tender, soft umbilical hernia at the upper side of his umbilicus - easily reducible 1 cm defect Ext:  Well-perfused; no edema Skin:  Warm, dry; no sign of jaundice  Data Reviewed None  Assessment  Small reducible umbilical hernia    Plan    We will obtain cardiac clearance from Dr. Allyson Sabal.  He will need to hold his plavix and aspirin for five days prior to surgery.  Recommend elective umbilical hernia repair with mesh.  The surgical procedure has been discussed with the patient.  Potential risks, benefits, alternative treatments, and expected outcomes have been explained.  All of the patient's questions at this time have been answered.  The likelihood of reaching the patient's treatment goal is good.  The patient understand the proposed surgical procedure and wishes to proceed. We will contact the patient  after we have received cardiac clearance to schedule to the case.        Oseas Detty K. 08/09/2011, 4:03 PM

## 2011-08-09 NOTE — Progress Notes (Signed)
Addended by: Wynona Luna on: 08/09/2011 04:15 PM   Modules accepted: Orders

## 2011-08-09 NOTE — Patient Instructions (Signed)
We will call you to schedule your surgery after we have received clearance from Dr. Allyson Sabal.  You will need to stop your Plavix and aspirin for five days before surgery.

## 2011-08-10 ENCOUNTER — Telehealth (INDEPENDENT_AMBULATORY_CARE_PROVIDER_SITE_OTHER): Payer: Self-pay

## 2011-08-10 NOTE — Telephone Encounter (Signed)
The patient hasn't been seen since last year.  He has an appointment on 4/5 with Dr Allyson Sabal.  They will address his clearance then.  Let them know if you need his appointment moved up.

## 2011-09-19 DIAGNOSIS — R079 Chest pain, unspecified: Secondary | ICD-10-CM

## 2011-09-19 HISTORY — DX: Chest pain, unspecified: R07.9

## 2011-09-27 ENCOUNTER — Encounter (INDEPENDENT_AMBULATORY_CARE_PROVIDER_SITE_OTHER): Payer: Self-pay

## 2011-10-05 ENCOUNTER — Encounter (HOSPITAL_COMMUNITY): Payer: Self-pay | Admitting: Pharmacy Technician

## 2011-10-11 ENCOUNTER — Encounter (HOSPITAL_COMMUNITY): Payer: Self-pay

## 2011-10-11 ENCOUNTER — Inpatient Hospital Stay (HOSPITAL_COMMUNITY): Admission: RE | Admit: 2011-10-11 | Discharge: 2011-10-11 | Payer: Medicare Other | Source: Ambulatory Visit

## 2011-10-11 HISTORY — DX: Atherosclerotic heart disease of native coronary artery without angina pectoris: I25.10

## 2011-10-11 NOTE — Pre-Procedure Instructions (Addendum)
20 Jonathan Bullock  10/11/2011   Your procedure is scheduled on: 10-19-2011@7 :30AM  Report to [facility] at 5:30 AM.  Call this number if you have problems the morning of surgery: [phone]   Remember:   Do not eat food:After Midnight.  May have clear liquids: up to 4 Hours before arrival.Until 1:30 AM  Clear liquids include soda, tea, black coffee, apple or grape juice, broth.  Take these medicines the morning of surgery with A SIP OF WATER:acebutolol,protonix   Do not wear jewelry, make-up or nail polish.  Do not wear lotions, powders, or perfumes. You may wear deodorant.  Do not shave 48 hours prior to surgery. Men may shave face and neck.  Do not bring valuables to the hospital.  Contacts, dentures or bridgework may not be worn into surgery.  Leave suitcase in the car. After surgery it may be brought to your room.  For patients admitted to the hospital, checkout time is 11:00 AM the day of discharge.   Patients discharged the day of surgery will not be allowed to drive home.  Name and phone number of your driver:  Special Instructions: CHG Shower Use Special Wash: 1/2 bottle night before surgery and 1/2 bottle morning of surgery.   Please read over the following fact sheets that you were given: Pain Booklet, Coughing and Deep Breathing, MRSA Information and Surgical Site Infection Prevention

## 2011-10-17 ENCOUNTER — Encounter (HOSPITAL_COMMUNITY): Admission: RE | Admit: 2011-10-17 | Payer: Medicare Other | Source: Ambulatory Visit

## 2011-10-17 ENCOUNTER — Encounter (HOSPITAL_COMMUNITY)
Admission: RE | Admit: 2011-10-17 | Discharge: 2011-10-17 | Disposition: A | Discharge status: Home / Self Care | Payer: Medicare Other | Source: Ambulatory Visit | Attending: Surgery | Admitting: Surgery

## 2011-10-17 LAB — CBC
HCT: 42.9 % (ref 39.0–52.0)
Hemoglobin: 15.3 g/dL (ref 13.0–17.0)
MCH: 34 pg (ref 26.0–34.0)
MCHC: 35.7 g/dL (ref 30.0–36.0)
MCV: 95.3 fL (ref 78.0–100.0)
RDW: 12.7 % (ref 11.5–15.5)

## 2011-10-17 LAB — BASIC METABOLIC PANEL
BUN: 15 mg/dL (ref 6–23)
CO2: 26 mEq/L (ref 19–32)
Chloride: 104 mEq/L (ref 96–112)
Creatinine, Ser: 0.87 mg/dL (ref 0.50–1.35)
GFR calc Af Amer: >90 mL/min (ref >90)
Glucose, Bld: 95 mg/dL (ref 70–99)
Potassium: 4.3 mEq/L (ref 3.5–5.1)

## 2011-10-17 LAB — SURGICAL PCR SCREEN
MRSA, PCR: NEGATIVE
Staphylococcus aureus: NEGATIVE

## 2011-10-18 MED ORDER — CEFAZOLIN SODIUM-DEXTROSE 2-3 GM-% IV SOLR
2.0000 g | INTRAVENOUS | Status: AC
  Administered 2011-10-19: 2 g via INTRAVENOUS
  Filled 2011-10-18: qty 50

## 2011-10-18 NOTE — H&P (Signed)
Chief Complaint   Patient presents with   .  New Evaluation       eval of umbilical hernia         HPI Jonathan Bullock is a 71 y.o. male.  Referred by Dr. Renne Crigler for evaluation of umbilical hernia HPI This is a 71 yo male who splits his time between [place] and [place] who presents with a recent onset of umbilical hernia.  The patient exercises regularly and was lifting some weights when he felt a pulling sensation at his umbilicus.  He has a developed a soft bulge in this area with minimal discomfort. It remains reducible.   His cardiologist is Dr. Allyson Sabal and he has an appointment in the next two weeks. Past Medical History   Diagnosis  Date   .  Hypertension     .  GERD (gastroesophageal reflux disease)     .  Umbilical hernia  08/09/2011         Past Surgical History   Procedure  Date   .  Knee arthroscopy  1998       right knee    .  Anal fistulectomy  1972/1974   .  Carotid stent  2007/2010       x3         Family History   Problem  Relation  Age of Onset   .  Heart disease  Mother     .  Heart disease  Father          Social History History   Substance Use Topics   .  Smoking status:  Current Some Day Smoker       Types:  Cigarettes   .  Smokeless tobacco:  Former Neurosurgeon   .  Alcohol Use:  7.0 oz/week       14 drink(s) per week        No Known Allergies    Current Outpatient Prescriptions   Medication  Sig  Dispense  Refill   .  acebutolol (SECTRAL) 200 MG capsule           .  aspirin 81 MG tablet  Take 81 mg by mouth daily.         .  clopidogrel (PLAVIX) 75 MG tablet           .  CRESTOR 20 MG tablet           .  Cyanocobalamin (VITAMIN B-12 PO)  Take by mouth.         Marland Kitchen  GLUCOSAMINE PO  Take by mouth.         .  Multiple Vitamins-Minerals (MULTIVITAMIN PO)  Take by mouth.         .  pantoprazole (PROTONIX) 40 MG tablet                Review of Systems Review of Systems  Constitutional: Negative for fever, chills and unexpected  weight change.  HENT: Negative for hearing loss, congestion, sore throat, trouble swallowing and voice change.   Eyes: Negative for visual disturbance.  Respiratory: Negative for cough and wheezing.   Cardiovascular: Negative for chest pain, palpitations and leg swelling.  Gastrointestinal: Negative for nausea, vomiting, abdominal pain, diarrhea, constipation, blood in stool, abdominal distention, anal bleeding and rectal pain.  Genitourinary: Negative for hematuria and difficulty urinating.  Musculoskeletal: Negative for arthralgias.  Skin: Negative for rash and wound.  Neurological: Negative for seizures, syncope, weakness and headaches.  Hematological: Negative for adenopathy. Does not bruise/bleed easily.  Psychiatric/Behavioral: Negative for confusion.      Blood pressure 148/92, pulse 60, temperature 97.1 F (36.2 C), temperature source Temporal, resp. rate 16, height 5\' 11"  (1.803 m), weight 194 lb 8 oz (88.225 kg).   Physical Exam Physical Exam WDWN in NAD HEENT:  EOMI, sclera anicteric Neck:  No masses, no thyromegaly Lungs:  CTA bilaterally; normal respiratory effort CV:  Regular rate and rhythm; no murmurs Abd:  +bowel sounds, soft, non-tender, soft umbilical hernia at the upper side of his umbilicus - easily reducible 1 cm defect Ext:  Well-perfused; no edema Skin:  Warm, dry; no sign of jaundice   Data Reviewed None   Assessment    Small reducible umbilical hernia     Plan    We will obtain cardiac clearance from Dr. Allyson Sabal.  He will need to hold his plavix and aspirin for five days prior to surgery.  Recommend elective umbilical hernia repair with mesh.  The surgical procedure has been discussed with the patient.  Potential risks, benefits, alternative treatments, and expected outcomes have been explained.  All of the patient's questions at this time have been answered.  The likelihood of reaching the patient's treatment goal is good.  The patient understand  the proposed surgical procedure and wishes to proceed. We will contact the patient after we have received cardiac clearance to schedule to the case.  Cardiac clearance on chart     Wilmon Arms. Corliss Skains, MD, [facility]  10/18/2011 4:05 PM

## 2011-10-19 ENCOUNTER — Encounter (HOSPITAL_COMMUNITY): Payer: Self-pay | Admitting: *Deleted

## 2011-10-19 ENCOUNTER — Ambulatory Visit (HOSPITAL_COMMUNITY): Payer: Medicare Other | Admitting: Anesthesiology

## 2011-10-19 ENCOUNTER — Ambulatory Visit (HOSPITAL_COMMUNITY)
Admission: RE | Admit: 2011-10-19 | Discharge: 2011-10-19 | Disposition: A | Discharge status: Home / Self Care | Payer: Medicare Other | Source: Ambulatory Visit | Attending: Surgery | Admitting: Surgery

## 2011-10-19 ENCOUNTER — Encounter (HOSPITAL_COMMUNITY): Payer: Self-pay | Admitting: Anesthesiology

## 2011-10-19 ENCOUNTER — Ambulatory Visit (HOSPITAL_COMMUNITY): Payer: Medicare Other

## 2011-10-19 ENCOUNTER — Encounter (HOSPITAL_COMMUNITY)
Admission: RE | Disposition: A | Discharge status: Home / Self Care | Payer: Self-pay | Source: Ambulatory Visit | Attending: Surgery

## 2011-10-19 DIAGNOSIS — K429 Umbilical hernia without obstruction or gangrene: Secondary | ICD-10-CM

## 2011-10-19 DIAGNOSIS — K219 Gastro-esophageal reflux disease without esophagitis: Secondary | ICD-10-CM | POA: Insufficient documentation

## 2011-10-19 DIAGNOSIS — F17200 Nicotine dependence, unspecified, uncomplicated: Secondary | ICD-10-CM | POA: Insufficient documentation

## 2011-10-19 DIAGNOSIS — I10 Essential (primary) hypertension: Secondary | ICD-10-CM | POA: Insufficient documentation

## 2011-10-19 DIAGNOSIS — Z01812 Encounter for preprocedural laboratory examination: Secondary | ICD-10-CM | POA: Insufficient documentation

## 2011-10-19 DIAGNOSIS — Z9861 Coronary angioplasty status: Secondary | ICD-10-CM | POA: Insufficient documentation

## 2011-10-19 DIAGNOSIS — I2510 Atherosclerotic heart disease of native coronary artery without angina pectoris: Secondary | ICD-10-CM | POA: Insufficient documentation

## 2011-10-19 HISTORY — PX: UMBILICAL HERNIA REPAIR: SHX196

## 2011-10-19 SURGERY — REPAIR, HERNIA, UMBILICAL, ADULT
Anesthesia: General | Wound class: Clean

## 2011-10-19 MED ORDER — MIDAZOLAM HCL 5 MG/5ML IJ SOLN
INTRAMUSCULAR | Status: DC | PRN
  Administered 2011-10-19: 2 mg via INTRAVENOUS

## 2011-10-19 MED ORDER — ONDANSETRON HCL 4 MG/2ML IJ SOLN
INTRAMUSCULAR | Status: DC | PRN
  Administered 2011-10-19: 4 mg via INTRAVENOUS

## 2011-10-19 MED ORDER — HYDROMORPHONE HCL PF 1 MG/ML IJ SOLN
0.2500 mg | INTRAMUSCULAR | Status: DC | PRN
  Administered 2011-10-19: 0.5 mg via INTRAVENOUS

## 2011-10-19 MED ORDER — 0.9 % SODIUM CHLORIDE (POUR BTL) OPTIME
TOPICAL | Status: DC | PRN
  Administered 2011-10-19: 1000 mL

## 2011-10-19 MED ORDER — ONDANSETRON HCL 4 MG/2ML IJ SOLN: 4.0000 mg | Freq: Four times a day (QID) | INTRAMUSCULAR | Status: DC | PRN

## 2011-10-19 MED ORDER — MORPHINE SULFATE 2 MG/ML IJ SOLN: 2.0000 mg | INTRAMUSCULAR | Status: DC | PRN

## 2011-10-19 MED ORDER — BUPIVACAINE-EPINEPHRINE 0.5% -1:200000 IJ SOLN
INTRAMUSCULAR | Status: DC | PRN
  Administered 2011-10-19: 16 mL

## 2011-10-19 MED ORDER — LIDOCAINE HCL (CARDIAC) 20 MG/ML IV SOLN
INTRAVENOUS | Status: DC | PRN
  Administered 2011-10-19: 80 mg via INTRAVENOUS

## 2011-10-19 MED ORDER — ONDANSETRON HCL 4 MG/2ML IJ SOLN: 4.0000 mg | INTRAMUSCULAR | Status: DC | PRN

## 2011-10-19 MED ORDER — OXYCODONE-ACETAMINOPHEN 5-325 MG PO TABS: 1.0000 | ORAL_TABLET | ORAL | Status: DC | PRN

## 2011-10-19 MED ORDER — OXYCODONE-ACETAMINOPHEN 5-325 MG PO TABS: 1.0000 | ORAL_TABLET | ORAL | Status: AC | PRN

## 2011-10-19 MED ORDER — LACTATED RINGERS IV SOLN
INTRAVENOUS | Status: DC | PRN
  Administered 2011-10-19 (×2): via INTRAVENOUS

## 2011-10-19 MED ORDER — PROPOFOL 10 MG/ML IV EMUL
INTRAVENOUS | Status: DC | PRN
  Administered 2011-10-19: 200 mg via INTRAVENOUS

## 2011-10-19 MED ORDER — FENTANYL CITRATE 0.05 MG/ML IJ SOLN
INTRAMUSCULAR | Status: DC | PRN
  Administered 2011-10-19: 50 ug via INTRAVENOUS

## 2011-10-19 SURGICAL SUPPLY — 46 items
APL SKNCLS STERI-STRIP NONHPOA (GAUZE/BANDAGES/DRESSINGS) ×1
BENZOIN TINCTURE PRP APPL 2/3 (GAUZE/BANDAGES/DRESSINGS) ×2 IMPLANT
BLADE SURG 15 STRL LF DISP TIS (BLADE) ×1 IMPLANT
BLADE SURG 15 STRL SS (BLADE) ×2
BLADE SURG ROTATE 9660 (MISCELLANEOUS) ×1 IMPLANT
CANISTER SUCTION 2500CC (MISCELLANEOUS) ×1 IMPLANT
CHLORAPREP W/TINT 26ML (MISCELLANEOUS) ×2 IMPLANT
CLOTH BEACON ORANGE TIMEOUT ST (SAFETY) ×2 IMPLANT
COVER SURGICAL LIGHT HANDLE (MISCELLANEOUS) ×2 IMPLANT
DRAPE PED LAPAROTOMY (DRAPES) ×2 IMPLANT
DRAPE UTILITY 15X26 W/TAPE STR (DRAPE) ×4 IMPLANT
DRSG TEGADERM 4X4.75 (GAUZE/BANDAGES/DRESSINGS) ×1 IMPLANT
ELECT CAUTERY BLADE 6.4 (BLADE) ×2 IMPLANT
ELECT REM PT RETURN 9FT ADLT (ELECTROSURGICAL) ×2
ELECTRODE REM PT RTRN 9FT ADLT (ELECTROSURGICAL) ×1 IMPLANT
GAUZE SPONGE 2X2 8PLY STRL LF (GAUZE/BANDAGES/DRESSINGS) IMPLANT
GAUZE SPONGE 4X4 16PLY XRAY LF (GAUZE/BANDAGES/DRESSINGS) ×2 IMPLANT
GLOVE BIO SURGEON STRL SZ7 (GLOVE) ×2 IMPLANT
GLOVE BIOGEL PI IND STRL 7.5 (GLOVE) ×1 IMPLANT
GLOVE BIOGEL PI INDICATOR 7.5 (GLOVE) ×1
GOWN STRL NON-REIN LRG LVL3 (GOWN DISPOSABLE) ×4 IMPLANT
KIT BASIN OR (CUSTOM PROCEDURE TRAY) ×2 IMPLANT
KIT ROOM TURNOVER OR (KITS) ×2 IMPLANT
NDL HYPO 25GX1X1/2 BEV (NEEDLE) ×1 IMPLANT
NEEDLE HYPO 25GX1X1/2 BEV (NEEDLE) ×2 IMPLANT
NS IRRIG 1000ML POUR BTL (IV SOLUTION) ×2 IMPLANT
PACK SURGICAL SETUP 50X90 (CUSTOM PROCEDURE TRAY) ×2 IMPLANT
PAD ARMBOARD 7.5X6 YLW CONV (MISCELLANEOUS) ×4 IMPLANT
PATCH VENTRAL SMALL 4.3 (Mesh Specialty) ×1 IMPLANT
PENCIL BUTTON HOLSTER BLD 10FT (ELECTRODE) ×2 IMPLANT
SPONGE GAUZE 2X2 STER 10/PKG (GAUZE/BANDAGES/DRESSINGS) ×1
SPONGE GAUZE 4X4 12PLY (GAUZE/BANDAGES/DRESSINGS) ×2 IMPLANT
SPONGE LAP 18X18 X RAY DECT (DISPOSABLE) ×2 IMPLANT
STRIP CLOSURE SKIN 1/2X4 (GAUZE/BANDAGES/DRESSINGS) ×2 IMPLANT
SUT MNCRL AB 4-0 PS2 18 (SUTURE) ×2 IMPLANT
SUT NOVA NAB DX-16 0-1 5-0 T12 (SUTURE) ×1 IMPLANT
SUT NOVA NAB GS-21 0 18 T12 DT (SUTURE) ×4 IMPLANT
SUT VIC AB 3-0 SH 27 (SUTURE) ×2
SUT VIC AB 3-0 SH 27X BRD (SUTURE) ×1 IMPLANT
SYR BULB 3OZ (MISCELLANEOUS) ×2 IMPLANT
SYR CONTROL 10ML LL (SYRINGE) ×2 IMPLANT
TOWEL OR 17X24 6PK STRL BLUE (TOWEL DISPOSABLE) ×2 IMPLANT
TOWEL OR 17X26 10 PK STRL BLUE (TOWEL DISPOSABLE) ×2 IMPLANT
TUBE CONNECTING 12X1/4 (SUCTIONS) ×1 IMPLANT
WATER STERILE IRR 1000ML POUR (IV SOLUTION) IMPLANT
YANKAUER SUCT BULB TIP NO VENT (SUCTIONS) ×2 IMPLANT

## 2011-10-19 NOTE — Transfer of Care (Signed)
Immediate Anesthesia Transfer of Care Note  Patient: Jonathan Bullock  Procedure(s) Performed: Procedure(s) (LRB): HERNIA REPAIR UMBILICAL ADULT (N/A) INSERTION OF MESH (N/A)  Patient Location: PACU  Anesthesia Type: General  Level of Consciousness: awake, alert  and oriented  Airway & Oxygen Therapy: Patient Spontanous Breathing and Patient connected to nasal cannula oxygen  Post-op Assessment: Report given to PACU RN, Post -op Vital signs reviewed and stable and Patient moving all extremities  Post vital signs: Reviewed and stable  Complications: No apparent anesthesia complications

## 2011-10-19 NOTE — Anesthesia Postprocedure Evaluation (Signed)
Anesthesia Post Note  Patient: Jonathan Bullock  Procedure(s) Performed: Procedure(s) (LRB): HERNIA REPAIR UMBILICAL ADULT (N/A) INSERTION OF MESH (N/A)  Anesthesia type: General  Patient location: PACU  Post pain: Pain level controlled and Adequate analgesia  Post assessment: Post-op Vital signs reviewed, Patient's Cardiovascular Status Stable, Respiratory Function Stable, Patent Airway and Pain level controlled  Last Vitals:  Filed Vitals:   10/19/11 0935  BP: 153/76  Pulse: 48  Temp:   Resp: 21    Post vital signs: Reviewed and stable  Level of consciousness: awake, alert  and oriented  Complications: No apparent anesthesia complications

## 2011-10-19 NOTE — Progress Notes (Signed)
Report given to phillip rn as caregiver 

## 2011-10-19 NOTE — Preoperative (Signed)
Beta Blockers   Reason not to administer Beta Blockers:beta blocker at 0430hrs 10/19/11

## 2011-10-19 NOTE — Interval H&P Note (Signed)
History and Physical Interval Note:  10/19/2011 9:23 AM  Jonathan Bullock  has presented today for surgery, with the diagnosis of Umbilical hernia  The various methods of treatment have been discussed with the patient and family. After consideration of risks, benefits and other options for treatment, the patient has consented to  Procedure(s) (LRB): HERNIA REPAIR UMBILICAL ADULT (N/A) INSERTION OF MESH (N/A) as a surgical intervention .  The patients' history has been reviewed, patient examined, no change in status, stable for surgery.  I have reviewed the patients' chart and labs.  Questions were answered to the patient's satisfaction.     Kristofor Michalowski K.

## 2011-10-19 NOTE — Anesthesia Preprocedure Evaluation (Signed)
Anesthesia Evaluation  Patient identified by MRN, date of birth, ID band Patient awake    Reviewed: Allergy & Precautions, H&P , NPO status , Patient's Chart, lab work & pertinent test results  Airway Mallampati: II  Neck ROM: full    Dental   Pulmonary Current Smoker,          Cardiovascular hypertension, + CAD and + Cardiac Stents     Neuro/Psych    GI/Hepatic GERD-  ,  Endo/Other    Renal/GU      Musculoskeletal   Abdominal   Peds  Hematology   Anesthesia Other Findings   Reproductive/Obstetrics                           Anesthesia Physical Anesthesia Plan  ASA: III  Anesthesia Plan: General   Post-op Pain Management:    Induction: Intravenous  Airway Management Planned: LMA  Additional Equipment:   Intra-op Plan:   Post-operative Plan:   Informed Consent: I have reviewed the patients History and Physical, chart, labs and discussed the procedure including the risks, benefits and alternatives for the proposed anesthesia with the patient or authorized representative who has indicated his/her understanding and acceptance.     Plan Discussed with: CRNA and Surgeon  Anesthesia Plan Comments:         Anesthesia Quick Evaluation

## 2011-10-19 NOTE — Op Note (Signed)
Indications:  The patient presented with a history of a reducible umbilical hernia.  The patient was examined and we recommended umbilical hernia repair with mesh.  Pre-operative diagnosis:  Umbilical hernia  Post-operative diagnosis:  Same  Surgeon: Jamier Urbas K.   Assistants: none  Anesthesia: General LMA anesthesia  ASA Class: 2   Procedure Details  The patient was seen again in the Holding Room. The risks, benefits, complications, treatment options, and expected outcomes were discussed with the patient. The possibilities of reaction to medication, pulmonary aspiration, perforation of viscus, bleeding, recurrent infection, the need for additional procedures, and development of a complication requiring transfusion or further operation were discussed with the patient and/or family. There was concurrence with the proposed plan, and informed consent was obtained. The site of surgery was properly noted/marked. The patient was taken to the Operating Room, identified as Jonathan Bullock, and the procedure verified as umbilical hernia repair. A Time Out was held and the above information confirmed.  After an adequate level of general anesthesia was obtained, the patient's abdomen was prepped with Chloraprep and draped in sterile fashion.  We made a transverse incision below the umbilicus.  Dissection was carried down to the hernia sac with cautery.  We dissected bluntly around the hernia sac down to the edge of the fascial defect.  We reduced the hernia sac back into the pre-peritoneal space.  The fascial defect measured 1.5.  We cleared the fascia in all directions.  A small Proceed ventral patch was inserted into the pre-peritoneal space and was deployed.  The mesh was secured with four trans-fascial sutures of 0 Novofil.  The fascial defect was closed with multiple interrupted figure-of-eight 1 Novofil sutures.  The base of the umbilicus was tacked down with 3-0 Vicryl.  3-0 Vicryl was used to close  the subcutaneous tissues and 4-0 Monocryl was used to close the skin.  Steri-strips and clean dressing were applied.  The patient was extubated and brought to the recovery room in stable condition.  All sponge, instrument, and needle counts were correct prior to closure and at the conclusion of the case.   Estimated Blood Loss: Minimal          Complications: None; patient tolerated the procedure well.         Disposition: PACU - hemodynamically stable.         Condition: stable  Wilmon Arms. Corliss Skains, MD, [facility]  10/19/2011 9:27 AM

## 2011-10-19 NOTE — Discharge Instructions (Signed)
[facility] ° °UMBILICAL HERNIA REPAIR: POST OP INSTRUCTIONS ° °Always review your discharge instruction sheet given to you by the facility where your surgery was performed. °IF YOU HAVE DISABILITY OR FAMILY LEAVE FORMS, YOU MUST BRING THEM TO THE OFFICE FOR PROCESSING.   °DO NOT GIVE THEM TO YOUR DOCTOR. ° °1. A  prescription for pain medication may be given to you upon discharge.  Take your pain medication as prescribed, if needed.  If narcotic pain medicine is not needed, then you may take acetaminophen (Tylenol) or ibuprofen (Advil) as needed. °2. Take your usually prescribed medications unless otherwise directed. °3. If you need a refill on your pain medication, please contact your pharmacy.  They will contact our office to request authorization. Prescriptions will not be filled after 5 pm or on week-ends. °4. You should follow a light diet the first 24 hours after arrival home, such as soup and crackers, etc.  Be sure to include lots of fluids daily.  Resume your normal diet the day after surgery. °5. Most patients will experience some swelling and bruising around the umbilicus or in the groin and scrotum.  Ice packs and reclining will help.  Swelling and bruising can take several days to resolve.  °6. It is common to experience some constipation if taking pain medication after surgery.  Increasing fluid intake and taking a stool softener (such as Colace) will usually help or prevent this problem from occurring.  A mild laxative (Milk of Magnesia or Miralax) should be taken according to package directions if there are no bowel movements after 48 hours. °7. Unless discharge instructions indicate otherwise, you may remove your bandages 24-48 hours after surgery, and you may shower at that time.  You will have steri-strips (small skin tapes) in place directly over the incision.  These strips should be left on the skin for 7-10 days. °8. ACTIVITIES:  You may resume regular (light) daily activities  beginning the next day--such as daily self-care, walking, climbing stairs--gradually increasing activities as tolerated.  You may have sexual intercourse when it is comfortable.  Refrain from any heavy lifting or straining until approved by your doctor. °a. You may drive when you are no longer taking prescription pain medication, you can comfortably wear a seatbelt, and you can safely maneuver your car and apply brakes. °b. RETURN TO WORK:  2-3 weeks with light duty - no lifting over 15 lbs. °9. You should see your doctor in the office for a follow-up appointment approximately 2-3 weeks after your surgery.  Make sure that you call for this appointment within a day or two after you arrive home to insure a convenient appointment time. °10. OTHER INSTRUCTIONS:  __________________________________________________________________________________________________________________________________________________________________________________________  °WHEN TO CALL YOUR DOCTOR: °1. Fever over 101.0 °2. Inability to urinate °3. Nausea and/or vomiting °4. Extreme swelling or bruising °5. Continued bleeding from incision. °6. Increased pain, redness, or drainage from the incision ° °The clinic staff is available to answer your questions during regular business hours.  Please don’t hesitate to call and ask to speak to one of the nurses for clinical concerns.  If you have a medical emergency, go to the nearest emergency room or call 911.  A surgeon from [facility] is always on call at the hospital ° ° °[address], [address], [place], [place]  [address] ? ° [address] [address], [place], [place]   [address] °[phone]    [phone]    FAX [phone] °Web site: [email or web address] ° ° °

## 2011-10-22 ENCOUNTER — Encounter (HOSPITAL_COMMUNITY): Payer: Self-pay | Admitting: Surgery

## 2011-11-02 ENCOUNTER — Ambulatory Visit (INDEPENDENT_AMBULATORY_CARE_PROVIDER_SITE_OTHER): Payer: Medicare Other | Admitting: Surgery

## 2011-11-02 ENCOUNTER — Encounter (INDEPENDENT_AMBULATORY_CARE_PROVIDER_SITE_OTHER): Payer: Self-pay | Admitting: Surgery

## 2011-11-02 VITALS — BP 122/88 | HR 54 | Temp 97.2°F | Resp 14 | Ht 71.0 in | Wt 194.2 lb

## 2011-11-02 DIAGNOSIS — K429 Umbilical hernia without obstruction or gangrene: Secondary | ICD-10-CM

## 2011-11-02 NOTE — Progress Notes (Signed)
Status post umbilical hernia repair with mesh on 10/19/11. He had a 1.5 cm defect that was repaired with a small proceed ventral patch. The patient only had pain for about a day and a half. The incision is well-healed. No sign of recurrent hernia. No current tenderness.  Impression: Umbilical hernia status post mesh repair Plan: The patient may begin increasing his level activity. He may follow up with [place] on a p.r.n. basis.  Wilmon Arms. Corliss Skains, MD, [facility]  11/02/2011 2:44 PM

## 2013-01-22 ENCOUNTER — Encounter: Payer: Self-pay | Admitting: Cardiovascular Disease

## 2013-01-23 ENCOUNTER — Encounter: Payer: Self-pay | Admitting: Cardiovascular Disease

## 2013-01-23 ENCOUNTER — Ambulatory Visit (INDEPENDENT_AMBULATORY_CARE_PROVIDER_SITE_OTHER): Payer: Medicare Other | Admitting: Cardiovascular Disease

## 2013-01-23 VITALS — BP 130/70 | HR 50 | Ht 71.5 in | Wt 192.2 lb

## 2013-01-23 DIAGNOSIS — I10 Essential (primary) hypertension: Secondary | ICD-10-CM

## 2013-01-23 DIAGNOSIS — I2510 Atherosclerotic heart disease of native coronary artery without angina pectoris: Secondary | ICD-10-CM

## 2013-01-23 DIAGNOSIS — E785 Hyperlipidemia, unspecified: Secondary | ICD-10-CM | POA: Insufficient documentation

## 2013-01-23 NOTE — Assessment & Plan Note (Signed)
Status post proximal LAD stenting by Dr. Alanda Amass 12/19/04. I re\re intervention 09/23/08 revealing high-grade stenosis at the distal edge of the previously placed stent which I stented using a 3.0 x 12 mm long Xience  5 drug-eluting stent. He had no other CAD and had normal LV function. His last Myoview performed 09/27/11 and was nonischemic. He denies chest pain or shortness of breath.

## 2013-01-23 NOTE — Patient Instructions (Addendum)
Your physician wants you to follow-up in: 1 year. You will receive a reminder letter in the mail two months in advance. If you don't receive a letter, please call our office to schedule the follow-up appointment.  

## 2013-01-23 NOTE — Progress Notes (Signed)
01/23/2013 Jonathan Bullock   07-26-1940  [id number]  Primary Physician Jonathan Moh, MD Primary Cardiologist: Jonathan Gess MD Jonathan Bullock   HPI:  The patient is a delightful, 72 year old, mildly overweight, married Caucasian male, father of 2, grandfather to 5 grandchildren who I last saw a year ago. His risk factors include hypertension and hyperlipidemia. He had stents placed to his mid LAD by Dr. Alanda Bullock many years ago and had chest pain leading to a Myoview that showed inferior and anterior ischemia. I catheterized him at [facility] Sep 23, 2008, revealing patent stents with a 95% stenosis just beyond the distal edge of the mid LAD which I stented with a Xience V drug-eluting stent (3.0 x 12 mm.) His remaining vessels were normal and his EF was normal as well. He denied chest pain or shortness of breath. Dr. Renne Bullock follows his lipid profile. He apparently needs a ventral hernia repair by Dr. Raynald Bullock. A Myoview stress test performed in May 2009 was entirely normal.since I last saw him he's been completely asymptomatic. He did have a Myoview stress test performed 09/27/11 which was normal. Dr. Renne Bullock follows his  lipid profile.   Current Outpatient Prescriptions  Medication Sig Dispense Refill  . acebutolol (SECTRAL) 200 MG capsule Take 200 mg by mouth 2 (two) times daily.       Marland Kitchen aspirin 81 MG tablet Take 81 mg by mouth daily.      . clopidogrel (PLAVIX) 75 MG tablet Take 75 mg by mouth daily.       . CRESTOR 20 MG tablet Take 20 mg by mouth daily.       . Cyanocobalamin (VITAMIN B-12 PO) Take by mouth.      . Multiple Vitamins-Minerals (MULTIVITAMIN PO) Take 1 tablet by mouth daily.       . pantoprazole (PROTONIX) 40 MG tablet        No current facility-administered medications for this visit.    No Known Allergies  History   Social History  . Marital Status: Married    Spouse Name: N/A    Number of Children: N/A  . Years of Education: N/A   Occupational  History  . Not on file.   Social History Main Topics  . Smoking status: Current Some Day Smoker -- 10 years    Types: Cigarettes, Cigars  . Smokeless tobacco: Never Used     Comment: 4 cigars/week  . Alcohol Use: 7.0 oz/week    14 drink(s) per week     Comment: 1 oz per day during week; 2oz/day on weekend  . Drug Use: No  . Sexual Activity: Not on file     Comment: 4 cigars a day   Other Topics Concern  . Not on file   Social History Narrative  . No narrative on file     Review of Systems: General: negative for chills, fever, night sweats or weight changes.  Cardiovascular: negative for chest pain, dyspnea on exertion, edema, orthopnea, palpitations, paroxysmal nocturnal dyspnea or shortness of breath Dermatological: negative for rash Respiratory: negative for cough or wheezing Urologic: negative for hematuria Abdominal: negative for nausea, vomiting, diarrhea, bright red blood per rectum, melena, or hematemesis Neurologic: negative for visual changes, syncope, or dizziness All other systems reviewed and are otherwise negative except as noted above.    Blood pressure 130/70, pulse 50, height 5' 11.5" (1.816 m), weight 192 lb 3.2 oz (87.181 kg).  General appearance: alert and no distress Neck: no adenopathy, no carotid  bruit, no JVD, supple, symmetrical, trachea midline and thyroid not enlarged, symmetric, no tenderness/mass/nodules Lungs: clear to auscultation bilaterally Heart: regular rate and rhythm, S1, S2 normal, no murmur, click, rub or gallop Extremities: extremities normal, atraumatic, no cyanosis or edema  EKG sinus bradycardia at 58 without ST or T wave changes  ASSESSMENT AND PLAN:   Hyperlipidemia On statin therapy followed by his PCP  Essential hypertension Well-controlled on current medications  Coronary artery disease Status post proximal LAD stenting by Dr. Alanda Bullock 12/19/04. I re\re intervention 09/23/08 revealing high-grade stenosis at the distal  edge of the previously placed stent which I stented using a 3.0 x 12 mm long Xience  5 drug-eluting stent. He had no other CAD and had normal LV function. His last Myoview performed 09/27/11 and was nonischemic. He denies chest pain or shortness of breath.      Jonathan Gess MD FACP,FACC,FAHA, [facility] 01/23/2013 9:46 AM

## 2013-01-23 NOTE — Assessment & Plan Note (Signed)
On statin therapy followed by his PCP 

## 2013-01-23 NOTE — Assessment & Plan Note (Signed)
Well-controlled on current medications 

## 2013-07-20 ENCOUNTER — Encounter: Payer: Self-pay | Admitting: General Surgery

## 2014-02-16 ENCOUNTER — Encounter: Payer: Self-pay | Admitting: Cardiovascular Disease

## 2014-02-16 ENCOUNTER — Ambulatory Visit (INDEPENDENT_AMBULATORY_CARE_PROVIDER_SITE_OTHER): Payer: Medicare Other | Admitting: Cardiovascular Disease

## 2014-02-16 VITALS — BP 136/86 | HR 54 | Ht 71.0 in | Wt 190.7 lb

## 2014-02-16 DIAGNOSIS — I2510 Atherosclerotic heart disease of native coronary artery without angina pectoris: Secondary | ICD-10-CM

## 2014-02-16 DIAGNOSIS — E785 Hyperlipidemia, unspecified: Secondary | ICD-10-CM

## 2014-02-16 DIAGNOSIS — I10 Essential (primary) hypertension: Secondary | ICD-10-CM

## 2014-02-16 NOTE — Assessment & Plan Note (Signed)
Controlled on current medications 

## 2014-02-16 NOTE — Assessment & Plan Note (Signed)
On statin therapy with recent blood work performed by his primary care physician 08/08/12 showing a total cholesterol 124, LDL 64 and HDL of 42

## 2014-02-16 NOTE — Assessment & Plan Note (Signed)
History of CAD status post remote stenting of his LAD by Dr. Rollene Fare. I restated the distal edge of his LAD stent after a Myoview stress test showed inferior and anterior ischemia. I used a Xience drug-eluting stent (3 mm x 12 comments). His other vessels were normal as was his ejection fraction. He denies chest pain or shortness of breath.

## 2014-02-16 NOTE — Progress Notes (Signed)
02/16/2014 Jonathan Bullock   1941/01/16  [id number]  Primary Physician Jonathan Pel, MD Primary Cardiologist: Jonathan Harp MD Jonathan Bullock   HPI:  The patient is a delightful, 73 year old, mildly overweight, married Caucasian male, father of 24, grandfather to 5 grandchildren who I last saw a year ago. His risk factors include hypertension and hyperlipidemia. He had stents placed to his mid LAD by Jonathan Bullock many years ago and had chest pain leading to a Myoview that showed inferior and anterior ischemia. I catheterized him at [facility] Sep 23, 2008, revealing patent stents with a 95% stenosis just beyond the distal edge of the mid LAD which I stented with a Xience V drug-eluting stent (3.0 x 12 mm.) His remaining vessels were normal and his EF was normal as well. He denied chest pain or shortness of breath. Jonathan Bullock follows his lipid profile. He apparently needs a ventral hernia repair by Jonathan Bullock. A Myoview stress test performed in May 2009 was entirely norma.  He did have a Myoview stress test performed 09/27/11 which was normal. Jonathan Bullock follows his lipid profile.since I saw him a year ago he has been completely asymptomatic specifically denying chest pain or shortness of breath.    Current Outpatient Prescriptions  Medication Sig Dispense Refill  . acebutolol (SECTRAL) 200 MG capsule Take 200 mg by mouth 2 (two) times daily.       Marland Kitchen aspirin 81 MG tablet Take 81 mg by mouth daily.      . clopidogrel (PLAVIX) 75 MG tablet Take 75 mg by mouth daily.       . CRESTOR 20 MG tablet Take 20 mg by mouth daily.       . Cyanocobalamin (VITAMIN B-12 PO) Take by mouth.      . Multiple Vitamins-Minerals (MULTIVITAMIN PO) Take 1 tablet by mouth daily.       . pantoprazole (PROTONIX) 40 MG tablet        No current facility-administered medications for this visit.    No Known Allergies  History   Social History  . Marital Status: Married    Spouse Name: N/A   Number of Children: N/A  . Years of Education: N/A   Occupational History  . Not on file.   Social History Main Topics  . Smoking status: Current Some Day Smoker -- 10 years    Types: Cigarettes, Cigars  . Smokeless tobacco: Never Used     Comment: 4 cigars/week  . Alcohol Use: 7.0 oz/week    14 drink(s) per week     Comment: 1 oz per day during week; 2oz/day on weekend  . Drug Use: No  . Sexual Activity: Not on file     Comment: 4 cigars a day   Other Topics Concern  . Not on file   Social History Narrative  . No narrative on file     Review of Systems: General: negative for chills, fever, night sweats or weight changes.  Cardiovascular: negative for chest pain, dyspnea on exertion, edema, orthopnea, palpitations, paroxysmal nocturnal dyspnea or shortness of breath Dermatological: negative for rash Respiratory: negative for cough or wheezing Urologic: negative for hematuria Abdominal: negative for nausea, vomiting, diarrhea, bright red blood per rectum, melena, or hematemesis Neurologic: negative for visual changes, syncope, or dizziness All other systems reviewed and are otherwise negative except as noted above.    Blood pressure 136/86, pulse 54, height 5\' 11"  (1.803 m), weight 190 lb 11.2 oz (86.501 kg).  General appearance: alert and no distress Neck: no adenopathy, no carotid bruit, no JVD, supple, symmetrical, trachea midline and thyroid not enlarged, symmetric, no tenderness/mass/nodules Lungs: clear to auscultation bilaterally Heart: regular rate and rhythm, S1, S2 normal, no murmur, click, rub or gallop Extremities: extremities normal, atraumatic, no cyanosis or edema  EKG sinus bradycardia at 54 with PACs  ASSESSMENT AND PLAN:   Coronary artery disease History of CAD status post remote stenting of his LAD by Jonathan Bullock. I restated the distal edge of his LAD stent after a Myoview stress test showed inferior and anterior ischemia. I used a Xience  drug-eluting stent (3 mm x 12 comments). His other vessels were normal as was his ejection fraction. He denies chest pain or shortness of breath.  Essential hypertension Controlled on current medications  Hyperlipidemia On statin therapy with recent blood work performed by his primary care physician 08/08/12 showing a total cholesterol 124, LDL 64 and HDL of Jonathan Bullock, Jonathan Bullock 02/16/2014 2:43 PM

## 2014-02-16 NOTE — Patient Instructions (Signed)
Your physician wants you to follow-up in: 1 year with Dr Berry. You will receive a reminder letter in the mail two months in advance. If you don't receive a letter, please call our office to schedule the follow-up appointment.  

## 2014-08-31 ENCOUNTER — Other Ambulatory Visit: Payer: Self-pay | Admitting: Internal Medicine

## 2014-08-31 DIAGNOSIS — F17200 Nicotine dependence, unspecified, uncomplicated: Secondary | ICD-10-CM

## 2014-08-31 DIAGNOSIS — Z87891 Personal history of nicotine dependence: Secondary | ICD-10-CM

## 2014-09-08 ENCOUNTER — Other Ambulatory Visit: Payer: Self-pay | Admitting: Internal Medicine

## 2014-09-08 ENCOUNTER — Ambulatory Visit
Admission: RE | Admit: 2014-09-08 | Discharge: 2014-09-08 | Disposition: A | Discharge status: Home / Self Care | Payer: Medicare Other | Source: Ambulatory Visit | Attending: Internal Medicine | Admitting: Internal Medicine

## 2014-09-08 DIAGNOSIS — F17200 Nicotine dependence, unspecified, uncomplicated: Secondary | ICD-10-CM

## 2014-09-08 DIAGNOSIS — F17210 Nicotine dependence, cigarettes, uncomplicated: Secondary | ICD-10-CM | POA: Diagnosis not present

## 2014-09-08 DIAGNOSIS — Z0000 Encounter for general adult medical examination without abnormal findings: Secondary | ICD-10-CM | POA: Diagnosis not present

## 2014-09-08 DIAGNOSIS — J984 Other disorders of lung: Secondary | ICD-10-CM | POA: Diagnosis not present

## 2014-09-08 DIAGNOSIS — Z125 Encounter for screening for malignant neoplasm of prostate: Secondary | ICD-10-CM | POA: Diagnosis not present

## 2014-09-08 DIAGNOSIS — K219 Gastro-esophageal reflux disease without esophagitis: Secondary | ICD-10-CM | POA: Diagnosis not present

## 2014-09-08 DIAGNOSIS — I2510 Atherosclerotic heart disease of native coronary artery without angina pectoris: Secondary | ICD-10-CM | POA: Diagnosis not present

## 2014-09-08 DIAGNOSIS — E780 Pure hypercholesterolemia: Secondary | ICD-10-CM | POA: Diagnosis not present

## 2014-09-13 DIAGNOSIS — E780 Pure hypercholesterolemia: Secondary | ICD-10-CM | POA: Diagnosis not present

## 2014-09-13 DIAGNOSIS — N401 Enlarged prostate with lower urinary tract symptoms: Secondary | ICD-10-CM | POA: Diagnosis not present

## 2014-09-13 DIAGNOSIS — R351 Nocturia: Secondary | ICD-10-CM | POA: Diagnosis not present

## 2014-09-13 DIAGNOSIS — I2510 Atherosclerotic heart disease of native coronary artery without angina pectoris: Secondary | ICD-10-CM | POA: Diagnosis not present

## 2014-12-28 DIAGNOSIS — C44519 Basal cell carcinoma of skin of other part of trunk: Secondary | ICD-10-CM | POA: Diagnosis not present

## 2014-12-28 DIAGNOSIS — L570 Actinic keratosis: Secondary | ICD-10-CM | POA: Diagnosis not present

## 2014-12-28 DIAGNOSIS — C44712 Basal cell carcinoma of skin of right lower limb, including hip: Secondary | ICD-10-CM | POA: Diagnosis not present

## 2014-12-28 DIAGNOSIS — D485 Neoplasm of uncertain behavior of skin: Secondary | ICD-10-CM | POA: Diagnosis not present

## 2014-12-28 DIAGNOSIS — Z85828 Personal history of other malignant neoplasm of skin: Secondary | ICD-10-CM | POA: Diagnosis not present

## 2015-01-11 DIAGNOSIS — L570 Actinic keratosis: Secondary | ICD-10-CM | POA: Diagnosis not present

## 2015-01-11 DIAGNOSIS — C44712 Basal cell carcinoma of skin of right lower limb, including hip: Secondary | ICD-10-CM | POA: Diagnosis not present

## 2015-01-11 DIAGNOSIS — D045 Carcinoma in situ of skin of trunk: Secondary | ICD-10-CM | POA: Diagnosis not present

## 2015-02-23 ENCOUNTER — Encounter: Payer: Self-pay | Admitting: Cardiovascular Disease

## 2015-02-23 ENCOUNTER — Ambulatory Visit (INDEPENDENT_AMBULATORY_CARE_PROVIDER_SITE_OTHER): Payer: Medicare Other | Admitting: Cardiovascular Disease

## 2015-02-23 VITALS — BP 138/80 | HR 48 | Ht 72.0 in | Wt 192.0 lb

## 2015-02-23 DIAGNOSIS — I2583 Coronary atherosclerosis due to lipid rich plaque: Secondary | ICD-10-CM

## 2015-02-23 DIAGNOSIS — E785 Hyperlipidemia, unspecified: Secondary | ICD-10-CM | POA: Diagnosis not present

## 2015-02-23 DIAGNOSIS — I10 Essential (primary) hypertension: Secondary | ICD-10-CM | POA: Diagnosis not present

## 2015-02-23 DIAGNOSIS — I2510 Atherosclerotic heart disease of native coronary artery without angina pectoris: Secondary | ICD-10-CM

## 2015-02-23 NOTE — Progress Notes (Signed)
02/23/2015 Jonathan Bullock   04-Feb-1941  [id number]  Primary Physician Horatio Pel, MD Primary Cardiologist: Lorretta Harp MD [name]   HPI:  The patient is a delightful, 74 year old, mildly overweight, married Caucasian male, father of 77, grandfather to 5 grandchildren who I last saw a year ago. His risk factors include hypertension and hyperlipidemia. He had stents placed to his mid LAD by Dr. Rollene Fare many years ago and had chest pain leading to a Myoview that showed inferior and anterior ischemia. I catheterized him at [facility] Sep 23, 2008, revealing patent stents with a 95% stenosis just beyond the distal edge of the mid LAD which I stented with a Xience V drug-eluting stent (3.0 x 12 mm.) His remaining vessels were normal and his EF was normal as well. He denied chest pain or shortness of breath. Dr. Shelia Media follows his lipid profile. He apparently needs a ventral hernia repair by Dr. Rigoberto Noel. A Myoview stress test performed in May 2009 was entirely norma. He did have a Myoview stress test performed 09/27/11 which was normal. Dr. Shelia Media follows his lipid profile. Since I saw him a year ago he has been completely asymptomatic specifically denying chest pain or shortness of breath.   Current Outpatient Prescriptions  Medication Sig Dispense Refill  . acebutolol (SECTRAL) 200 MG capsule Take 200 mg by mouth 2 (two) times daily.     Marland Kitchen aspirin 81 MG tablet Take 81 mg by mouth daily.    . clopidogrel (PLAVIX) 75 MG tablet Take 75 mg by mouth daily.     . CRESTOR 20 MG tablet Take 20 mg by mouth daily.     . Cyanocobalamin (VITAMIN B-12 PO) Take by mouth.    . Multiple Vitamins-Minerals (MULTIVITAMIN PO) Take 1 tablet by mouth daily.     . pantoprazole (PROTONIX) 40 MG tablet      No current facility-administered medications for this visit.    No Known Allergies  Social History   Social History  . Marital Status: Married    Spouse Name: N/A  . Number of  Children: N/A  . Years of Education: N/A   Occupational History  . Not on file.   Social History Main Topics  . Smoking status: Current Some Day Smoker -- 10 years    Types: Cigarettes, Cigars  . Smokeless tobacco: Never Used     Comment: 4 cigars/week  . Alcohol Use: 7.0 oz/week    14 drink(s) per week     Comment: 1 oz per day during week; 2oz/day on weekend  . Drug Use: No  . Sexual Activity: Not on file     Comment: 4 cigars a day   Other Topics Concern  . Not on file   Social History Narrative     Review of Systems: General: negative for chills, fever, night sweats or weight changes.  Cardiovascular: negative for chest pain, dyspnea on exertion, edema, orthopnea, palpitations, paroxysmal nocturnal dyspnea or shortness of breath Dermatological: negative for rash Respiratory: negative for cough or wheezing Urologic: negative for hematuria Abdominal: negative for nausea, vomiting, diarrhea, bright red blood per rectum, melena, or hematemesis Neurologic: negative for visual changes, syncope, or dizziness All other systems reviewed and are otherwise negative except as noted above.    Blood pressure 138/80, pulse 48, height 6' (1.829 m), weight 192 lb (87.091 kg).  General appearance: alert and no distress Neck: no adenopathy, no carotid bruit, no JVD, supple, symmetrical, trachea midline and thyroid not  enlarged, symmetric, no tenderness/mass/nodules Lungs: clear to auscultation bilaterally Heart: regular rate and rhythm, S1, S2 normal, no murmur, click, rub or gallop Extremities: extremities normal, atraumatic, no cyanosis or edema  EKG normal sinus rhythm at 48 without ST or T-wave changes. I personally reviewed this EKG  ASSESSMENT AND PLAN:   Hyperlipidemia History of hyperlipidemia on Crestor 20 mg a day followed by his PCP  Essential hypertension History of hypertension with blood pressure measured at 138/80. He is on Sectral. Continue current meds at current  dosing  Coronary artery disease History of CAD status post mid LAD stenting by Dr. Rollene Fare remotely. I performed cardiac catheterization on him 09/23/08 after Myoview revealed inferior and anterior ischemia. This was notable for a 95% stenosis just beyond the distal edge of the mid LAD stent which I stented using a Xience 5 drug-eluting stent (3 mm x 12 mm). He had normal vessels otherwise and normal EF. He is fairly active and denies chest pain or shortness of breath. He remains on dual antiplatelets therapy.      Lorretta Harp MD FACP,FACC,FAHA, [facility] 02/23/2015 4:39 PM

## 2015-02-23 NOTE — Assessment & Plan Note (Signed)
History of CAD status post mid LAD stenting by Dr. Rollene Fare remotely. I performed cardiac catheterization on him 09/23/08 after Myoview revealed inferior and anterior ischemia. This was notable for a 95% stenosis just beyond the distal edge of the mid LAD stent which I stented using a Xience 5 drug-eluting stent (3 mm x 12 mm). He had normal vessels otherwise and normal EF. He is fairly active and denies chest pain or shortness of breath. He remains on dual antiplatelets therapy.

## 2015-02-23 NOTE — Assessment & Plan Note (Signed)
History of hyperlipidemia on Crestor 20 mg a day followed by his PCP 

## 2015-02-23 NOTE — Patient Instructions (Signed)
Medication Instructions:  Your physician recommends that you continue on your current medications as directed. Please refer to the Current Medication list given to you today.  Labwork: I will get your lab work from your Primary Care Physician - Dr. Shelia Media.   Testing/Procedures: none  Follow-Up: Your physician wants you to follow-up in: 12 months with Dr. Gwenlyn Found. You will receive a reminder letter in the mail two months in advance. If you don't receive a letter, please call our office to schedule the follow-up appointment.   Any Other Special Instructions Will Be Listed Below (If Applicable).

## 2015-02-23 NOTE — Assessment & Plan Note (Signed)
History of hypertension with blood pressure measured at 138/80. He is on Sectral. Continue current meds at current dosing

## 2015-04-01 DIAGNOSIS — Z23 Encounter for immunization: Secondary | ICD-10-CM | POA: Diagnosis not present

## 2015-04-05 DIAGNOSIS — Z85828 Personal history of other malignant neoplasm of skin: Secondary | ICD-10-CM | POA: Diagnosis not present

## 2015-04-05 DIAGNOSIS — D1801 Hemangioma of skin and subcutaneous tissue: Secondary | ICD-10-CM | POA: Diagnosis not present

## 2015-04-05 DIAGNOSIS — L821 Other seborrheic keratosis: Secondary | ICD-10-CM | POA: Diagnosis not present

## 2015-04-05 DIAGNOSIS — L718 Other rosacea: Secondary | ICD-10-CM | POA: Diagnosis not present

## 2015-04-05 DIAGNOSIS — L814 Other melanin hyperpigmentation: Secondary | ICD-10-CM | POA: Diagnosis not present

## 2015-04-05 DIAGNOSIS — L570 Actinic keratosis: Secondary | ICD-10-CM | POA: Diagnosis not present

## 2015-04-05 DIAGNOSIS — D692 Other nonthrombocytopenic purpura: Secondary | ICD-10-CM | POA: Diagnosis not present

## 2015-06-29 DIAGNOSIS — J111 Influenza due to unidentified influenza virus with other respiratory manifestations: Secondary | ICD-10-CM | POA: Diagnosis not present

## 2015-06-29 DIAGNOSIS — R6889 Other general symptoms and signs: Secondary | ICD-10-CM | POA: Diagnosis not present

## 2015-08-26 DIAGNOSIS — M545 Low back pain: Secondary | ICD-10-CM | POA: Diagnosis not present

## 2015-08-26 DIAGNOSIS — R001 Bradycardia, unspecified: Secondary | ICD-10-CM | POA: Diagnosis not present

## 2015-08-26 DIAGNOSIS — M549 Dorsalgia, unspecified: Secondary | ICD-10-CM | POA: Diagnosis not present

## 2015-08-30 DIAGNOSIS — H2513 Age-related nuclear cataract, bilateral: Secondary | ICD-10-CM | POA: Diagnosis not present

## 2015-09-06 DIAGNOSIS — M5136 Other intervertebral disc degeneration, lumbar region: Secondary | ICD-10-CM | POA: Diagnosis not present

## 2015-09-06 DIAGNOSIS — M5432 Sciatica, left side: Secondary | ICD-10-CM | POA: Diagnosis not present

## 2015-09-21 DIAGNOSIS — M5136 Other intervertebral disc degeneration, lumbar region: Secondary | ICD-10-CM | POA: Diagnosis not present

## 2015-09-28 DIAGNOSIS — M5432 Sciatica, left side: Secondary | ICD-10-CM | POA: Diagnosis not present

## 2015-09-28 DIAGNOSIS — M5136 Other intervertebral disc degeneration, lumbar region: Secondary | ICD-10-CM | POA: Diagnosis not present

## 2015-10-05 DIAGNOSIS — M5136 Other intervertebral disc degeneration, lumbar region: Secondary | ICD-10-CM | POA: Diagnosis not present

## 2015-10-10 DIAGNOSIS — E7800 Pure hypercholesterolemia, unspecified: Secondary | ICD-10-CM | POA: Diagnosis not present

## 2015-10-10 DIAGNOSIS — Z0000 Encounter for general adult medical examination without abnormal findings: Secondary | ICD-10-CM | POA: Diagnosis not present

## 2015-10-10 DIAGNOSIS — I2510 Atherosclerotic heart disease of native coronary artery without angina pectoris: Secondary | ICD-10-CM | POA: Diagnosis not present

## 2015-10-10 DIAGNOSIS — Z125 Encounter for screening for malignant neoplasm of prostate: Secondary | ICD-10-CM | POA: Diagnosis not present

## 2015-10-11 DIAGNOSIS — L821 Other seborrheic keratosis: Secondary | ICD-10-CM | POA: Diagnosis not present

## 2015-10-11 DIAGNOSIS — D692 Other nonthrombocytopenic purpura: Secondary | ICD-10-CM | POA: Diagnosis not present

## 2015-10-11 DIAGNOSIS — D485 Neoplasm of uncertain behavior of skin: Secondary | ICD-10-CM | POA: Diagnosis not present

## 2015-10-11 DIAGNOSIS — C44629 Squamous cell carcinoma of skin of left upper limb, including shoulder: Secondary | ICD-10-CM | POA: Diagnosis not present

## 2015-10-11 DIAGNOSIS — L570 Actinic keratosis: Secondary | ICD-10-CM | POA: Diagnosis not present

## 2015-10-11 DIAGNOSIS — Z85828 Personal history of other malignant neoplasm of skin: Secondary | ICD-10-CM | POA: Diagnosis not present

## 2015-10-13 DIAGNOSIS — Z1212 Encounter for screening for malignant neoplasm of rectum: Secondary | ICD-10-CM | POA: Diagnosis not present

## 2015-10-13 DIAGNOSIS — K219 Gastro-esophageal reflux disease without esophagitis: Secondary | ICD-10-CM | POA: Diagnosis not present

## 2015-10-13 DIAGNOSIS — Z0000 Encounter for general adult medical examination without abnormal findings: Secondary | ICD-10-CM | POA: Diagnosis not present

## 2015-10-13 DIAGNOSIS — I2510 Atherosclerotic heart disease of native coronary artery without angina pectoris: Secondary | ICD-10-CM | POA: Diagnosis not present

## 2015-10-13 DIAGNOSIS — E7800 Pure hypercholesterolemia, unspecified: Secondary | ICD-10-CM | POA: Diagnosis not present

## 2015-10-19 DIAGNOSIS — M5432 Sciatica, left side: Secondary | ICD-10-CM | POA: Diagnosis not present

## 2015-10-19 DIAGNOSIS — M5136 Other intervertebral disc degeneration, lumbar region: Secondary | ICD-10-CM | POA: Diagnosis not present

## 2015-12-01 DIAGNOSIS — I2510 Atherosclerotic heart disease of native coronary artery without angina pectoris: Secondary | ICD-10-CM | POA: Diagnosis not present

## 2015-12-21 DIAGNOSIS — D692 Other nonthrombocytopenic purpura: Secondary | ICD-10-CM | POA: Diagnosis not present

## 2015-12-21 DIAGNOSIS — L570 Actinic keratosis: Secondary | ICD-10-CM | POA: Diagnosis not present

## 2015-12-21 DIAGNOSIS — L814 Other melanin hyperpigmentation: Secondary | ICD-10-CM | POA: Diagnosis not present

## 2015-12-21 DIAGNOSIS — L821 Other seborrheic keratosis: Secondary | ICD-10-CM | POA: Diagnosis not present

## 2016-03-15 DIAGNOSIS — Z23 Encounter for immunization: Secondary | ICD-10-CM | POA: Diagnosis not present

## 2016-03-28 DIAGNOSIS — M5136 Other intervertebral disc degeneration, lumbar region: Secondary | ICD-10-CM | POA: Diagnosis not present

## 2016-03-28 DIAGNOSIS — M5432 Sciatica, left side: Secondary | ICD-10-CM | POA: Diagnosis not present

## 2016-04-19 DIAGNOSIS — D1801 Hemangioma of skin and subcutaneous tissue: Secondary | ICD-10-CM | POA: Diagnosis not present

## 2016-04-19 DIAGNOSIS — L821 Other seborrheic keratosis: Secondary | ICD-10-CM | POA: Diagnosis not present

## 2016-04-19 DIAGNOSIS — L570 Actinic keratosis: Secondary | ICD-10-CM | POA: Diagnosis not present

## 2016-04-19 DIAGNOSIS — Z85828 Personal history of other malignant neoplasm of skin: Secondary | ICD-10-CM | POA: Diagnosis not present

## 2016-05-09 DIAGNOSIS — Z1211 Encounter for screening for malignant neoplasm of colon: Secondary | ICD-10-CM | POA: Diagnosis not present

## 2016-05-29 DIAGNOSIS — I2510 Atherosclerotic heart disease of native coronary artery without angina pectoris: Secondary | ICD-10-CM | POA: Diagnosis not present

## 2016-07-09 DIAGNOSIS — Z23 Encounter for immunization: Secondary | ICD-10-CM | POA: Diagnosis not present

## 2016-07-24 DIAGNOSIS — Z85828 Personal history of other malignant neoplasm of skin: Secondary | ICD-10-CM | POA: Diagnosis not present

## 2016-07-24 DIAGNOSIS — L080 Pyoderma: Secondary | ICD-10-CM | POA: Diagnosis not present

## 2016-07-24 DIAGNOSIS — D485 Neoplasm of uncertain behavior of skin: Secondary | ICD-10-CM | POA: Diagnosis not present

## 2016-08-06 DIAGNOSIS — Z23 Encounter for immunization: Secondary | ICD-10-CM | POA: Diagnosis not present

## 2016-08-08 DIAGNOSIS — Z800 Family history of malignant neoplasm of digestive organs: Secondary | ICD-10-CM | POA: Diagnosis not present

## 2016-08-08 DIAGNOSIS — Z86010 Personal history of colonic polyps: Secondary | ICD-10-CM | POA: Diagnosis not present

## 2016-08-08 DIAGNOSIS — K649 Unspecified hemorrhoids: Secondary | ICD-10-CM | POA: Diagnosis not present

## 2016-08-08 DIAGNOSIS — K5730 Diverticulosis of large intestine without perforation or abscess without bleeding: Secondary | ICD-10-CM | POA: Diagnosis not present

## 2016-08-08 DIAGNOSIS — K5790 Diverticulosis of intestine, part unspecified, without perforation or abscess without bleeding: Secondary | ICD-10-CM | POA: Diagnosis not present

## 2016-08-08 DIAGNOSIS — D122 Benign neoplasm of ascending colon: Secondary | ICD-10-CM | POA: Diagnosis not present

## 2016-08-08 DIAGNOSIS — D123 Benign neoplasm of transverse colon: Secondary | ICD-10-CM | POA: Diagnosis not present

## 2016-08-08 DIAGNOSIS — K648 Other hemorrhoids: Secondary | ICD-10-CM | POA: Diagnosis not present

## 2016-10-11 DIAGNOSIS — M5432 Sciatica, left side: Secondary | ICD-10-CM | POA: Diagnosis not present

## 2016-10-11 DIAGNOSIS — M5136 Other intervertebral disc degeneration, lumbar region: Secondary | ICD-10-CM | POA: Diagnosis not present

## 2017-01-10 DIAGNOSIS — Z23 Encounter for immunization: Secondary | ICD-10-CM | POA: Diagnosis not present

## 2017-01-10 DIAGNOSIS — Z0000 Encounter for general adult medical examination without abnormal findings: Secondary | ICD-10-CM | POA: Diagnosis not present

## 2017-01-10 DIAGNOSIS — E7800 Pure hypercholesterolemia, unspecified: Secondary | ICD-10-CM | POA: Diagnosis not present

## 2017-01-10 DIAGNOSIS — Z125 Encounter for screening for malignant neoplasm of prostate: Secondary | ICD-10-CM | POA: Diagnosis not present

## 2017-01-10 DIAGNOSIS — Z1212 Encounter for screening for malignant neoplasm of rectum: Secondary | ICD-10-CM | POA: Diagnosis not present

## 2017-01-15 DIAGNOSIS — R351 Nocturia: Secondary | ICD-10-CM | POA: Diagnosis not present

## 2017-01-15 DIAGNOSIS — N401 Enlarged prostate with lower urinary tract symptoms: Secondary | ICD-10-CM | POA: Diagnosis not present

## 2017-01-15 DIAGNOSIS — E7800 Pure hypercholesterolemia, unspecified: Secondary | ICD-10-CM | POA: Diagnosis not present

## 2017-01-15 DIAGNOSIS — K219 Gastro-esophageal reflux disease without esophagitis: Secondary | ICD-10-CM | POA: Diagnosis not present

## 2017-01-24 DIAGNOSIS — K449 Diaphragmatic hernia without obstruction or gangrene: Secondary | ICD-10-CM | POA: Diagnosis not present

## 2017-01-24 DIAGNOSIS — K2270 Barrett's esophagus without dysplasia: Secondary | ICD-10-CM | POA: Diagnosis not present

## 2017-01-24 DIAGNOSIS — K200 Eosinophilic esophagitis: Secondary | ICD-10-CM | POA: Diagnosis not present

## 2017-02-27 DIAGNOSIS — Z23 Encounter for immunization: Secondary | ICD-10-CM | POA: Diagnosis not present

## 2017-04-18 DIAGNOSIS — H2513 Age-related nuclear cataract, bilateral: Secondary | ICD-10-CM | POA: Diagnosis not present

## 2017-04-18 DIAGNOSIS — M5432 Sciatica, left side: Secondary | ICD-10-CM | POA: Diagnosis not present

## 2017-04-18 DIAGNOSIS — M5136 Other intervertebral disc degeneration, lumbar region: Secondary | ICD-10-CM | POA: Diagnosis not present

## 2017-06-06 DIAGNOSIS — I2510 Atherosclerotic heart disease of native coronary artery without angina pectoris: Secondary | ICD-10-CM | POA: Diagnosis not present

## 2017-08-22 DIAGNOSIS — J019 Acute sinusitis, unspecified: Secondary | ICD-10-CM | POA: Diagnosis not present

## 2017-10-02 DIAGNOSIS — Z7982 Long term (current) use of aspirin: Secondary | ICD-10-CM | POA: Diagnosis not present

## 2017-10-02 DIAGNOSIS — L03116 Cellulitis of left lower limb: Secondary | ICD-10-CM | POA: Diagnosis not present

## 2017-10-02 DIAGNOSIS — E7800 Pure hypercholesterolemia, unspecified: Secondary | ICD-10-CM | POA: Diagnosis not present

## 2017-10-02 DIAGNOSIS — I2510 Atherosclerotic heart disease of native coronary artery without angina pectoris: Secondary | ICD-10-CM | POA: Diagnosis not present

## 2017-10-16 DIAGNOSIS — M5432 Sciatica, left side: Secondary | ICD-10-CM | POA: Diagnosis not present

## 2017-10-16 DIAGNOSIS — M5136 Other intervertebral disc degeneration, lumbar region: Secondary | ICD-10-CM | POA: Diagnosis not present

## 2017-12-02 DIAGNOSIS — D1801 Hemangioma of skin and subcutaneous tissue: Secondary | ICD-10-CM | POA: Diagnosis not present

## 2017-12-02 DIAGNOSIS — D485 Neoplasm of uncertain behavior of skin: Secondary | ICD-10-CM | POA: Diagnosis not present

## 2017-12-02 DIAGNOSIS — L578 Other skin changes due to chronic exposure to nonionizing radiation: Secondary | ICD-10-CM | POA: Diagnosis not present

## 2017-12-02 DIAGNOSIS — L718 Other rosacea: Secondary | ICD-10-CM | POA: Diagnosis not present

## 2017-12-02 DIAGNOSIS — L821 Other seborrheic keratosis: Secondary | ICD-10-CM | POA: Diagnosis not present

## 2017-12-02 DIAGNOSIS — L820 Inflamed seborrheic keratosis: Secondary | ICD-10-CM | POA: Diagnosis not present

## 2017-12-02 DIAGNOSIS — L570 Actinic keratosis: Secondary | ICD-10-CM | POA: Diagnosis not present

## 2017-12-02 DIAGNOSIS — D692 Other nonthrombocytopenic purpura: Secondary | ICD-10-CM | POA: Diagnosis not present

## 2017-12-02 DIAGNOSIS — L720 Epidermal cyst: Secondary | ICD-10-CM | POA: Diagnosis not present

## 2018-01-30 DIAGNOSIS — Z125 Encounter for screening for malignant neoplasm of prostate: Secondary | ICD-10-CM | POA: Diagnosis not present

## 2018-01-30 DIAGNOSIS — E7800 Pure hypercholesterolemia, unspecified: Secondary | ICD-10-CM | POA: Diagnosis not present

## 2018-02-04 DIAGNOSIS — L719 Rosacea, unspecified: Secondary | ICD-10-CM | POA: Diagnosis not present

## 2018-02-04 DIAGNOSIS — N401 Enlarged prostate with lower urinary tract symptoms: Secondary | ICD-10-CM | POA: Diagnosis not present

## 2018-02-04 DIAGNOSIS — Z7982 Long term (current) use of aspirin: Secondary | ICD-10-CM | POA: Diagnosis not present

## 2018-02-04 DIAGNOSIS — E7800 Pure hypercholesterolemia, unspecified: Secondary | ICD-10-CM | POA: Diagnosis not present

## 2018-02-04 DIAGNOSIS — K5730 Diverticulosis of large intestine without perforation or abscess without bleeding: Secondary | ICD-10-CM | POA: Diagnosis not present

## 2018-02-04 DIAGNOSIS — J329 Chronic sinusitis, unspecified: Secondary | ICD-10-CM | POA: Diagnosis not present

## 2018-02-04 DIAGNOSIS — I2510 Atherosclerotic heart disease of native coronary artery without angina pectoris: Secondary | ICD-10-CM | POA: Diagnosis not present

## 2018-02-04 DIAGNOSIS — N400 Enlarged prostate without lower urinary tract symptoms: Secondary | ICD-10-CM | POA: Diagnosis not present

## 2018-02-04 DIAGNOSIS — R351 Nocturia: Secondary | ICD-10-CM | POA: Diagnosis not present

## 2018-02-04 DIAGNOSIS — K219 Gastro-esophageal reflux disease without esophagitis: Secondary | ICD-10-CM | POA: Diagnosis not present

## 2018-02-04 DIAGNOSIS — K2270 Barrett's esophagus without dysplasia: Secondary | ICD-10-CM | POA: Diagnosis not present

## 2018-02-04 DIAGNOSIS — Z1212 Encounter for screening for malignant neoplasm of rectum: Secondary | ICD-10-CM | POA: Diagnosis not present

## 2018-02-13 DIAGNOSIS — R197 Diarrhea, unspecified: Secondary | ICD-10-CM | POA: Diagnosis not present

## 2018-02-13 DIAGNOSIS — R112 Nausea with vomiting, unspecified: Secondary | ICD-10-CM | POA: Diagnosis not present

## 2018-04-11 DIAGNOSIS — R05 Cough: Secondary | ICD-10-CM | POA: Diagnosis not present

## 2018-04-11 DIAGNOSIS — J302 Other seasonal allergic rhinitis: Secondary | ICD-10-CM | POA: Diagnosis not present

## 2018-04-11 DIAGNOSIS — J0100 Acute maxillary sinusitis, unspecified: Secondary | ICD-10-CM | POA: Diagnosis not present

## 2018-04-14 DIAGNOSIS — C44729 Squamous cell carcinoma of skin of left lower limb, including hip: Secondary | ICD-10-CM | POA: Diagnosis not present

## 2018-04-14 DIAGNOSIS — D485 Neoplasm of uncertain behavior of skin: Secondary | ICD-10-CM | POA: Diagnosis not present

## 2018-04-14 DIAGNOSIS — L853 Xerosis cutis: Secondary | ICD-10-CM | POA: Diagnosis not present

## 2018-06-05 DIAGNOSIS — I2510 Atherosclerotic heart disease of native coronary artery without angina pectoris: Secondary | ICD-10-CM | POA: Diagnosis not present

## 2018-06-06 DIAGNOSIS — C44729 Squamous cell carcinoma of skin of left lower limb, including hip: Secondary | ICD-10-CM | POA: Diagnosis not present

## 2018-07-01 DIAGNOSIS — L720 Epidermal cyst: Secondary | ICD-10-CM | POA: Diagnosis not present

## 2018-07-01 DIAGNOSIS — Z85828 Personal history of other malignant neoplasm of skin: Secondary | ICD-10-CM | POA: Diagnosis not present

## 2018-07-25 DIAGNOSIS — I2510 Atherosclerotic heart disease of native coronary artery without angina pectoris: Secondary | ICD-10-CM | POA: Diagnosis not present

## 2018-08-25 DIAGNOSIS — W548XXA Other contact with dog, initial encounter: Secondary | ICD-10-CM | POA: Diagnosis not present

## 2018-08-25 DIAGNOSIS — S61409A Unspecified open wound of unspecified hand, initial encounter: Secondary | ICD-10-CM | POA: Diagnosis not present

## 2018-10-01 DIAGNOSIS — N401 Enlarged prostate with lower urinary tract symptoms: Secondary | ICD-10-CM | POA: Diagnosis not present

## 2018-10-01 DIAGNOSIS — R350 Frequency of micturition: Secondary | ICD-10-CM | POA: Diagnosis not present

## 2018-10-01 DIAGNOSIS — R3912 Poor urinary stream: Secondary | ICD-10-CM | POA: Diagnosis not present

## 2018-10-01 DIAGNOSIS — N3941 Urge incontinence: Secondary | ICD-10-CM | POA: Diagnosis not present

## 2018-10-01 DIAGNOSIS — R351 Nocturia: Secondary | ICD-10-CM | POA: Diagnosis not present

## 2018-10-21 DIAGNOSIS — H33193 Other retinoschisis and retinal cysts, bilateral: Secondary | ICD-10-CM | POA: Diagnosis not present

## 2018-10-21 DIAGNOSIS — H2513 Age-related nuclear cataract, bilateral: Secondary | ICD-10-CM | POA: Diagnosis not present

## 2018-11-03 DIAGNOSIS — R3912 Poor urinary stream: Secondary | ICD-10-CM | POA: Diagnosis not present

## 2018-11-03 DIAGNOSIS — N401 Enlarged prostate with lower urinary tract symptoms: Secondary | ICD-10-CM | POA: Diagnosis not present

## 2018-11-03 DIAGNOSIS — R351 Nocturia: Secondary | ICD-10-CM | POA: Diagnosis not present

## 2018-11-03 DIAGNOSIS — N3941 Urge incontinence: Secondary | ICD-10-CM | POA: Diagnosis not present

## 2018-11-03 DIAGNOSIS — R350 Frequency of micturition: Secondary | ICD-10-CM | POA: Diagnosis not present

## 2018-11-14 DIAGNOSIS — J329 Chronic sinusitis, unspecified: Secondary | ICD-10-CM | POA: Diagnosis not present

## 2018-12-18 ENCOUNTER — Other Ambulatory Visit: Payer: Self-pay

## 2019-01-12 DIAGNOSIS — L821 Other seborrheic keratosis: Secondary | ICD-10-CM | POA: Diagnosis not present

## 2019-01-12 DIAGNOSIS — Z08 Encounter for follow-up examination after completed treatment for malignant neoplasm: Secondary | ICD-10-CM | POA: Diagnosis not present

## 2019-01-12 DIAGNOSIS — L853 Xerosis cutis: Secondary | ICD-10-CM | POA: Diagnosis not present

## 2019-01-12 DIAGNOSIS — L814 Other melanin hyperpigmentation: Secondary | ICD-10-CM | POA: Diagnosis not present

## 2019-01-12 DIAGNOSIS — L738 Other specified follicular disorders: Secondary | ICD-10-CM | POA: Diagnosis not present

## 2019-01-12 DIAGNOSIS — D692 Other nonthrombocytopenic purpura: Secondary | ICD-10-CM | POA: Diagnosis not present

## 2019-01-12 DIAGNOSIS — Z85828 Personal history of other malignant neoplasm of skin: Secondary | ICD-10-CM | POA: Diagnosis not present

## 2019-01-25 DIAGNOSIS — S81812A Laceration without foreign body, left lower leg, initial encounter: Secondary | ICD-10-CM | POA: Diagnosis not present

## 2019-02-05 DIAGNOSIS — R351 Nocturia: Secondary | ICD-10-CM | POA: Diagnosis not present

## 2019-02-05 DIAGNOSIS — N3941 Urge incontinence: Secondary | ICD-10-CM | POA: Diagnosis not present

## 2019-02-05 DIAGNOSIS — R350 Frequency of micturition: Secondary | ICD-10-CM | POA: Diagnosis not present

## 2019-02-05 DIAGNOSIS — R3912 Poor urinary stream: Secondary | ICD-10-CM | POA: Diagnosis not present

## 2019-02-05 DIAGNOSIS — N401 Enlarged prostate with lower urinary tract symptoms: Secondary | ICD-10-CM | POA: Diagnosis not present

## 2019-02-13 DIAGNOSIS — Z23 Encounter for immunization: Secondary | ICD-10-CM | POA: Diagnosis not present

## 2019-03-02 DIAGNOSIS — E7800 Pure hypercholesterolemia, unspecified: Secondary | ICD-10-CM | POA: Diagnosis not present

## 2019-03-02 DIAGNOSIS — Z125 Encounter for screening for malignant neoplasm of prostate: Secondary | ICD-10-CM | POA: Diagnosis not present

## 2019-03-09 DIAGNOSIS — K219 Gastro-esophageal reflux disease without esophagitis: Secondary | ICD-10-CM | POA: Diagnosis not present

## 2019-03-09 DIAGNOSIS — Z0000 Encounter for general adult medical examination without abnormal findings: Secondary | ICD-10-CM | POA: Diagnosis not present

## 2019-03-09 DIAGNOSIS — I2510 Atherosclerotic heart disease of native coronary artery without angina pectoris: Secondary | ICD-10-CM | POA: Diagnosis not present

## 2019-03-09 DIAGNOSIS — E7800 Pure hypercholesterolemia, unspecified: Secondary | ICD-10-CM | POA: Diagnosis not present

## 2019-03-17 DIAGNOSIS — M19011 Primary osteoarthritis, right shoulder: Secondary | ICD-10-CM | POA: Diagnosis not present

## 2019-03-17 DIAGNOSIS — M19012 Primary osteoarthritis, left shoulder: Secondary | ICD-10-CM | POA: Diagnosis not present

## 2019-07-15 DIAGNOSIS — M19011 Primary osteoarthritis, right shoulder: Secondary | ICD-10-CM | POA: Diagnosis not present

## 2019-07-15 DIAGNOSIS — M19012 Primary osteoarthritis, left shoulder: Secondary | ICD-10-CM | POA: Diagnosis not present

## 2019-07-22 DIAGNOSIS — R3912 Poor urinary stream: Secondary | ICD-10-CM | POA: Diagnosis not present

## 2019-07-22 DIAGNOSIS — R350 Frequency of micturition: Secondary | ICD-10-CM | POA: Diagnosis not present

## 2019-07-22 DIAGNOSIS — R351 Nocturia: Secondary | ICD-10-CM | POA: Diagnosis not present

## 2019-07-22 DIAGNOSIS — N3941 Urge incontinence: Secondary | ICD-10-CM | POA: Diagnosis not present

## 2019-07-22 DIAGNOSIS — N401 Enlarged prostate with lower urinary tract symptoms: Secondary | ICD-10-CM | POA: Diagnosis not present

## 2019-07-29 DIAGNOSIS — E782 Mixed hyperlipidemia: Secondary | ICD-10-CM | POA: Diagnosis not present

## 2019-07-29 DIAGNOSIS — I10 Essential (primary) hypertension: Secondary | ICD-10-CM | POA: Diagnosis not present

## 2019-07-29 DIAGNOSIS — I25810 Atherosclerosis of coronary artery bypass graft(s) without angina pectoris: Secondary | ICD-10-CM | POA: Diagnosis not present

## 2019-09-07 DIAGNOSIS — M19011 Primary osteoarthritis, right shoulder: Secondary | ICD-10-CM | POA: Diagnosis not present

## 2019-09-07 DIAGNOSIS — M25511 Pain in right shoulder: Secondary | ICD-10-CM | POA: Diagnosis not present

## 2019-09-07 DIAGNOSIS — M25512 Pain in left shoulder: Secondary | ICD-10-CM | POA: Diagnosis not present

## 2019-09-07 DIAGNOSIS — M19012 Primary osteoarthritis, left shoulder: Secondary | ICD-10-CM | POA: Diagnosis not present

## 2019-09-16 DIAGNOSIS — D1801 Hemangioma of skin and subcutaneous tissue: Secondary | ICD-10-CM | POA: Diagnosis not present

## 2019-09-16 DIAGNOSIS — L720 Epidermal cyst: Secondary | ICD-10-CM | POA: Diagnosis not present

## 2019-09-16 DIAGNOSIS — D225 Melanocytic nevi of trunk: Secondary | ICD-10-CM | POA: Diagnosis not present

## 2019-09-16 DIAGNOSIS — L570 Actinic keratosis: Secondary | ICD-10-CM | POA: Diagnosis not present

## 2019-09-16 DIAGNOSIS — L820 Inflamed seborrheic keratosis: Secondary | ICD-10-CM | POA: Diagnosis not present

## 2019-09-16 DIAGNOSIS — Z85828 Personal history of other malignant neoplasm of skin: Secondary | ICD-10-CM | POA: Diagnosis not present

## 2019-09-16 DIAGNOSIS — L821 Other seborrheic keratosis: Secondary | ICD-10-CM | POA: Diagnosis not present

## 2019-09-23 DIAGNOSIS — M25412 Effusion, left shoulder: Secondary | ICD-10-CM | POA: Diagnosis not present

## 2019-09-23 DIAGNOSIS — M67814 Other specified disorders of tendon, left shoulder: Secondary | ICD-10-CM | POA: Diagnosis not present

## 2019-09-23 DIAGNOSIS — S46012A Strain of muscle(s) and tendon(s) of the rotator cuff of left shoulder, initial encounter: Secondary | ICD-10-CM | POA: Diagnosis not present

## 2019-09-23 DIAGNOSIS — S43432A Superior glenoid labrum lesion of left shoulder, initial encounter: Secondary | ICD-10-CM | POA: Diagnosis not present

## 2019-09-23 DIAGNOSIS — M12812 Other specific arthropathies, not elsewhere classified, left shoulder: Secondary | ICD-10-CM | POA: Diagnosis not present

## 2019-09-25 DIAGNOSIS — M19012 Primary osteoarthritis, left shoulder: Secondary | ICD-10-CM | POA: Diagnosis not present

## 2019-10-27 DIAGNOSIS — H2513 Age-related nuclear cataract, bilateral: Secondary | ICD-10-CM | POA: Diagnosis not present

## 2019-10-27 DIAGNOSIS — H33103 Unspecified retinoschisis, bilateral: Secondary | ICD-10-CM | POA: Diagnosis not present

## 2019-10-27 DIAGNOSIS — H524 Presbyopia: Secondary | ICD-10-CM | POA: Diagnosis not present

## 2019-12-03 DIAGNOSIS — Z01810 Encounter for preprocedural cardiovascular examination: Secondary | ICD-10-CM | POA: Diagnosis not present

## 2019-12-03 DIAGNOSIS — Z01812 Encounter for preprocedural laboratory examination: Secondary | ICD-10-CM | POA: Diagnosis not present

## 2019-12-03 DIAGNOSIS — M19012 Primary osteoarthritis, left shoulder: Secondary | ICD-10-CM | POA: Diagnosis not present

## 2019-12-09 DIAGNOSIS — Z471 Aftercare following joint replacement surgery: Secondary | ICD-10-CM | POA: Diagnosis not present

## 2019-12-09 DIAGNOSIS — I10 Essential (primary) hypertension: Secondary | ICD-10-CM | POA: Diagnosis not present

## 2019-12-09 DIAGNOSIS — F17200 Nicotine dependence, unspecified, uncomplicated: Secondary | ICD-10-CM | POA: Diagnosis not present

## 2019-12-09 DIAGNOSIS — M25712 Osteophyte, left shoulder: Secondary | ICD-10-CM | POA: Diagnosis not present

## 2019-12-09 DIAGNOSIS — K219 Gastro-esophageal reflux disease without esophagitis: Secondary | ICD-10-CM | POA: Diagnosis not present

## 2019-12-09 DIAGNOSIS — M25512 Pain in left shoulder: Secondary | ICD-10-CM | POA: Diagnosis not present

## 2019-12-09 DIAGNOSIS — Z96612 Presence of left artificial shoulder joint: Secondary | ICD-10-CM | POA: Diagnosis not present

## 2019-12-09 DIAGNOSIS — Z7902 Long term (current) use of antithrombotics/antiplatelets: Secondary | ICD-10-CM | POA: Diagnosis not present

## 2019-12-09 DIAGNOSIS — Z7982 Long term (current) use of aspirin: Secondary | ICD-10-CM | POA: Diagnosis not present

## 2019-12-09 DIAGNOSIS — Z79899 Other long term (current) drug therapy: Secondary | ICD-10-CM | POA: Diagnosis not present

## 2019-12-09 DIAGNOSIS — Z955 Presence of coronary angioplasty implant and graft: Secondary | ICD-10-CM | POA: Diagnosis not present

## 2019-12-09 DIAGNOSIS — G8918 Other acute postprocedural pain: Secondary | ICD-10-CM | POA: Diagnosis not present

## 2019-12-09 DIAGNOSIS — I2510 Atherosclerotic heart disease of native coronary artery without angina pectoris: Secondary | ICD-10-CM | POA: Diagnosis not present

## 2019-12-09 DIAGNOSIS — K279 Peptic ulcer, site unspecified, unspecified as acute or chronic, without hemorrhage or perforation: Secondary | ICD-10-CM | POA: Diagnosis not present

## 2019-12-09 DIAGNOSIS — M19012 Primary osteoarthritis, left shoulder: Secondary | ICD-10-CM | POA: Diagnosis not present

## 2019-12-10 DIAGNOSIS — I10 Essential (primary) hypertension: Secondary | ICD-10-CM | POA: Diagnosis not present

## 2019-12-10 DIAGNOSIS — M19012 Primary osteoarthritis, left shoulder: Secondary | ICD-10-CM | POA: Diagnosis not present

## 2019-12-10 DIAGNOSIS — K279 Peptic ulcer, site unspecified, unspecified as acute or chronic, without hemorrhage or perforation: Secondary | ICD-10-CM | POA: Diagnosis not present

## 2019-12-10 DIAGNOSIS — I2510 Atherosclerotic heart disease of native coronary artery without angina pectoris: Secondary | ICD-10-CM | POA: Diagnosis not present

## 2019-12-10 DIAGNOSIS — M25712 Osteophyte, left shoulder: Secondary | ICD-10-CM | POA: Diagnosis not present

## 2019-12-10 DIAGNOSIS — K219 Gastro-esophageal reflux disease without esophagitis: Secondary | ICD-10-CM | POA: Diagnosis not present

## 2019-12-21 DIAGNOSIS — Z96612 Presence of left artificial shoulder joint: Secondary | ICD-10-CM | POA: Diagnosis not present

## 2020-01-06 DIAGNOSIS — M25612 Stiffness of left shoulder, not elsewhere classified: Secondary | ICD-10-CM | POA: Diagnosis not present

## 2020-01-06 DIAGNOSIS — M25512 Pain in left shoulder: Secondary | ICD-10-CM | POA: Diagnosis not present

## 2020-01-08 DIAGNOSIS — M25512 Pain in left shoulder: Secondary | ICD-10-CM | POA: Diagnosis not present

## 2020-01-08 DIAGNOSIS — M25612 Stiffness of left shoulder, not elsewhere classified: Secondary | ICD-10-CM | POA: Diagnosis not present

## 2020-01-13 DIAGNOSIS — M25612 Stiffness of left shoulder, not elsewhere classified: Secondary | ICD-10-CM | POA: Diagnosis not present

## 2020-01-13 DIAGNOSIS — M25512 Pain in left shoulder: Secondary | ICD-10-CM | POA: Diagnosis not present

## 2020-01-18 DIAGNOSIS — M25612 Stiffness of left shoulder, not elsewhere classified: Secondary | ICD-10-CM | POA: Diagnosis not present

## 2020-01-18 DIAGNOSIS — M25512 Pain in left shoulder: Secondary | ICD-10-CM | POA: Diagnosis not present

## 2020-01-20 DIAGNOSIS — M25612 Stiffness of left shoulder, not elsewhere classified: Secondary | ICD-10-CM | POA: Diagnosis not present

## 2020-01-20 DIAGNOSIS — M25512 Pain in left shoulder: Secondary | ICD-10-CM | POA: Diagnosis not present

## 2020-01-27 DIAGNOSIS — M25612 Stiffness of left shoulder, not elsewhere classified: Secondary | ICD-10-CM | POA: Diagnosis not present

## 2020-01-27 DIAGNOSIS — M25512 Pain in left shoulder: Secondary | ICD-10-CM | POA: Diagnosis not present

## 2020-02-03 DIAGNOSIS — M25512 Pain in left shoulder: Secondary | ICD-10-CM | POA: Diagnosis not present

## 2020-02-03 DIAGNOSIS — M25612 Stiffness of left shoulder, not elsewhere classified: Secondary | ICD-10-CM | POA: Diagnosis not present

## 2020-02-10 DIAGNOSIS — M25612 Stiffness of left shoulder, not elsewhere classified: Secondary | ICD-10-CM | POA: Diagnosis not present

## 2020-02-10 DIAGNOSIS — M25512 Pain in left shoulder: Secondary | ICD-10-CM | POA: Diagnosis not present

## 2020-02-12 DIAGNOSIS — M25512 Pain in left shoulder: Secondary | ICD-10-CM | POA: Diagnosis not present

## 2020-02-12 DIAGNOSIS — M25612 Stiffness of left shoulder, not elsewhere classified: Secondary | ICD-10-CM | POA: Diagnosis not present

## 2020-02-17 DIAGNOSIS — C44729 Squamous cell carcinoma of skin of left lower limb, including hip: Secondary | ICD-10-CM | POA: Diagnosis not present

## 2020-02-17 DIAGNOSIS — D485 Neoplasm of uncertain behavior of skin: Secondary | ICD-10-CM | POA: Diagnosis not present

## 2020-02-17 DIAGNOSIS — Z85828 Personal history of other malignant neoplasm of skin: Secondary | ICD-10-CM | POA: Diagnosis not present

## 2020-02-24 DIAGNOSIS — M25612 Stiffness of left shoulder, not elsewhere classified: Secondary | ICD-10-CM | POA: Diagnosis not present

## 2020-02-24 DIAGNOSIS — M25512 Pain in left shoulder: Secondary | ICD-10-CM | POA: Diagnosis not present

## 2020-02-26 DIAGNOSIS — M25612 Stiffness of left shoulder, not elsewhere classified: Secondary | ICD-10-CM | POA: Diagnosis not present

## 2020-02-26 DIAGNOSIS — M25512 Pain in left shoulder: Secondary | ICD-10-CM | POA: Diagnosis not present

## 2020-03-02 DIAGNOSIS — M25512 Pain in left shoulder: Secondary | ICD-10-CM | POA: Diagnosis not present

## 2020-03-02 DIAGNOSIS — M25612 Stiffness of left shoulder, not elsewhere classified: Secondary | ICD-10-CM | POA: Diagnosis not present

## 2020-03-03 DIAGNOSIS — I2510 Atherosclerotic heart disease of native coronary artery without angina pectoris: Secondary | ICD-10-CM | POA: Diagnosis not present

## 2020-03-09 DIAGNOSIS — Z23 Encounter for immunization: Secondary | ICD-10-CM | POA: Diagnosis not present

## 2020-03-17 DIAGNOSIS — M25512 Pain in left shoulder: Secondary | ICD-10-CM | POA: Diagnosis not present

## 2020-03-17 DIAGNOSIS — M25612 Stiffness of left shoulder, not elsewhere classified: Secondary | ICD-10-CM | POA: Diagnosis not present

## 2020-03-23 DIAGNOSIS — M25512 Pain in left shoulder: Secondary | ICD-10-CM | POA: Diagnosis not present

## 2020-03-23 DIAGNOSIS — M25612 Stiffness of left shoulder, not elsewhere classified: Secondary | ICD-10-CM | POA: Diagnosis not present

## 2020-03-29 DIAGNOSIS — M25512 Pain in left shoulder: Secondary | ICD-10-CM | POA: Diagnosis not present

## 2020-03-29 DIAGNOSIS — M25612 Stiffness of left shoulder, not elsewhere classified: Secondary | ICD-10-CM | POA: Diagnosis not present

## 2020-03-31 DIAGNOSIS — M19012 Primary osteoarthritis, left shoulder: Secondary | ICD-10-CM | POA: Diagnosis not present

## 2020-03-31 DIAGNOSIS — Z96612 Presence of left artificial shoulder joint: Secondary | ICD-10-CM | POA: Diagnosis not present

## 2020-04-04 DIAGNOSIS — Z23 Encounter for immunization: Secondary | ICD-10-CM | POA: Diagnosis not present

## 2020-04-04 DIAGNOSIS — M25512 Pain in left shoulder: Secondary | ICD-10-CM | POA: Diagnosis not present

## 2020-04-04 DIAGNOSIS — M25612 Stiffness of left shoulder, not elsewhere classified: Secondary | ICD-10-CM | POA: Diagnosis not present

## 2020-04-06 DIAGNOSIS — M25512 Pain in left shoulder: Secondary | ICD-10-CM | POA: Diagnosis not present

## 2020-04-06 DIAGNOSIS — M25612 Stiffness of left shoulder, not elsewhere classified: Secondary | ICD-10-CM | POA: Diagnosis not present

## 2020-04-12 DIAGNOSIS — M25612 Stiffness of left shoulder, not elsewhere classified: Secondary | ICD-10-CM | POA: Diagnosis not present

## 2020-04-12 DIAGNOSIS — M25512 Pain in left shoulder: Secondary | ICD-10-CM | POA: Diagnosis not present

## 2020-04-20 DIAGNOSIS — M25512 Pain in left shoulder: Secondary | ICD-10-CM | POA: Diagnosis not present

## 2020-04-20 DIAGNOSIS — M25612 Stiffness of left shoulder, not elsewhere classified: Secondary | ICD-10-CM | POA: Diagnosis not present

## 2020-04-26 DIAGNOSIS — Z125 Encounter for screening for malignant neoplasm of prostate: Secondary | ICD-10-CM | POA: Diagnosis not present

## 2020-04-26 DIAGNOSIS — K219 Gastro-esophageal reflux disease without esophagitis: Secondary | ICD-10-CM | POA: Diagnosis not present

## 2020-04-26 DIAGNOSIS — E7800 Pure hypercholesterolemia, unspecified: Secondary | ICD-10-CM | POA: Diagnosis not present

## 2020-04-29 DIAGNOSIS — M25512 Pain in left shoulder: Secondary | ICD-10-CM | POA: Diagnosis not present

## 2020-04-29 DIAGNOSIS — M25612 Stiffness of left shoulder, not elsewhere classified: Secondary | ICD-10-CM | POA: Diagnosis not present

## 2020-05-03 DIAGNOSIS — Z0001 Encounter for general adult medical examination with abnormal findings: Secondary | ICD-10-CM | POA: Diagnosis not present

## 2020-05-03 DIAGNOSIS — Z1212 Encounter for screening for malignant neoplasm of rectum: Secondary | ICD-10-CM | POA: Diagnosis not present

## 2020-05-03 DIAGNOSIS — Z7901 Long term (current) use of anticoagulants: Secondary | ICD-10-CM | POA: Diagnosis not present

## 2020-05-03 DIAGNOSIS — K219 Gastro-esophageal reflux disease without esophagitis: Secondary | ICD-10-CM | POA: Diagnosis not present

## 2020-05-03 DIAGNOSIS — I2510 Atherosclerotic heart disease of native coronary artery without angina pectoris: Secondary | ICD-10-CM | POA: Diagnosis not present

## 2020-05-03 DIAGNOSIS — N400 Enlarged prostate without lower urinary tract symptoms: Secondary | ICD-10-CM | POA: Diagnosis not present

## 2020-05-03 DIAGNOSIS — D229 Melanocytic nevi, unspecified: Secondary | ICD-10-CM | POA: Diagnosis not present

## 2020-05-03 DIAGNOSIS — N401 Enlarged prostate with lower urinary tract symptoms: Secondary | ICD-10-CM | POA: Diagnosis not present

## 2020-05-05 DIAGNOSIS — Z85828 Personal history of other malignant neoplasm of skin: Secondary | ICD-10-CM | POA: Diagnosis not present

## 2020-05-05 DIAGNOSIS — D1801 Hemangioma of skin and subcutaneous tissue: Secondary | ICD-10-CM | POA: Diagnosis not present

## 2020-05-18 DIAGNOSIS — M25512 Pain in left shoulder: Secondary | ICD-10-CM | POA: Diagnosis not present

## 2020-05-18 DIAGNOSIS — M25612 Stiffness of left shoulder, not elsewhere classified: Secondary | ICD-10-CM | POA: Diagnosis not present

## 2021-05-25 ENCOUNTER — Ambulatory Visit: Payer: Medicare Other | Admitting: Cardiovascular Disease

## 2021-06-01 NOTE — Patient Instructions (Addendum)
DUE TO COVID-19 ONLY ONE VISITOR IS ALLOWED TO COME WITH YOU AND STAY IN THE WAITING ROOM ONLY DURING PRE OP AND PROCEDURE DAY OF SURGERY IF YOU ARE GOING HOME AFTER SURGERY. IF YOU ARE SPENDING THE NIGHT 2 PEOPLE MAY VISIT WITH YOU IN YOUR PRIVATE ROOM AFTER SURGERY UNTIL VISITING  HOURS ARE OVER AT 800 PM AND 1  VISITOR  MAY  SPEND THE NIGHT.                 Jonathan Bullock     Your procedure is scheduled on: 06/15/21   Report to [facility] Main  Entrance   Report to admitting at   7:15 AM     Call this number if you have problems the morning of surgery [phone]   No food after midnight.    You may have clear liquid until 7:00 AM.    At 6:45 AM drink pre surgery drink.   Nothing by mouth after 7:00 AM.    CLEAR LIQUID DIET   Foods Allowed                                                                     Foods Excluded  Coffee and tea, regular and decaf                             liquids that you cannot  Plain Jell-O any favor except red or purple                                           see through such as: Fruit ices (not with fruit pulp)                                     milk, soups, orange juice  Iced Popsicles                                    All solid food Carbonated beverages, regular and diet                                    Cranberry, grape and apple juices Sports drinks like Gatorade Lightly seasoned clear broth or consume(fat free) Sugar    BRUSH YOUR TEETH MORNING OF SURGERY AND RINSE YOUR MOUTH OUT, NO CHEWING GUM CANDY OR MINTS.     Take these medicines the morning of surgery with A SIP OF WATER: Acebutolol, Tamsulosin                                You may not have any metal on your body including              piercings  Do not wear jewelry, lotions, powders or deodorant  Men may shave face and neck.   Do not bring valuables to the hospital. [place].  Contacts,  dentures or bridgework may not be worn into surgery.     Patients discharged the day of surgery will not be allowed to drive home.  IF YOU ARE HAVING SURGERY AND GOING HOME THE SAME DAY, YOU MUST HAVE AN ADULT TO DRIVE YOU HOME AND BE WITH YOU FOR 24 HOURS. YOU MAY GO HOME BY TAXI OR UBER OR ORTHERWISE, BUT AN ADULT MUST ACCOMPANY YOU HOME AND STAY WITH YOU FOR 24 HOURS.  Name and phone number of your driver:  Special Instructions: N/A              Please read over the following fact sheets you were given: _____________________________________________________________________  [facility]- Preparing for Total Shoulder Arthroplasty    Before surgery, you can play an important role. Because skin is not sterile, your skin needs to be as free of germs as possible. You can reduce the number of germs on your skin by using the following products. Benzoyl Peroxide Gel Reduces the number of germs present on the skin Applied twice a day to shoulder area starting two days before surgery    ==================================================================  Please follow these instructions carefully:  BENZOYL PEROXIDE 5% GEL  Please do not use if you have an allergy to benzoyl peroxide.   If your skin becomes reddened/irritated stop using the benzoyl peroxide.  Starting two days before surgery, apply as follows: Apply benzoyl peroxide in the morning and at night. Apply after taking a shower. If you are not taking a shower clean entire shoulder front, back, and side along with the armpit with a clean wet washcloth.  Place a quarter-sized dollop on your shoulder and rub in thoroughly, making sure to cover the front, back, and side of your shoulder, along with the armpit.   2 days before ____ AM   ____ PM              1 day before ____ AM   ____ PM                         Do this twice a day for two days.  (Last application is the night before surgery, AFTER using the CHG soap as described  below).  Do NOT apply benzoyl peroxide gel on the day of surgery.             [place] - Preparing for Surgery Before surgery, you can play an important role.  Because skin is not sterile, your skin needs to be as free of germs as possible.  You can reduce the number of germs on your skin by washing with CHG (chlorahexidine gluconate) soap before surgery.  CHG is an antiseptic cleaner which kills germs and bonds with the skin to continue killing germs even after washing. Please DO NOT use if you have an allergy to CHG or antibacterial soaps.  If your skin becomes reddened/irritated stop using the CHG and inform your nurse when you arrive at Short Stay.  You may shave your face/neck. Please follow these instructions carefully:  1.  Shower with CHG Soap the night before surgery and the  morning of Surgery.  2.  If you choose to wash your hair, wash your hair first as usual with your  normal  shampoo.  3.  After you shampoo, rinse your hair and body thoroughly to remove the  shampoo.                          pcr  4.  Use CHG as you would any other liquid soap.  You can apply chg directly  to the skin and wash                       Gently with a scrungie or clean washcloth.  5.  Apply the CHG Soap to your body ONLY FROM THE NECK DOWN.   Do not use on face/ open                           Wound or open sores. Avoid contact with eyes, ears mouth and genitals (private parts).                       Wash face,  Genitals (private parts) with your normal soap.             6.  Wash thoroughly, paying special attention to the area where your surgery  will be performed.  7.  Thoroughly rinse your body with warm water from the neck down.  8.  DO NOT shower/wash with your normal soap after using and rinsing off  the CHG Soap.                9.  Pat yourself dry with a clean towel.            10.  Wear clean pajamas.            11.  Place clean sheets on your bed the night of your first shower and do not   sleep with pets. Day of Surgery : Do not apply any lotions/deodorants the morning of surgery.  Please wear clean clothes to the hospital/surgery center.  FAILURE TO FOLLOW THESE INSTRUCTIONS MAY RESULT IN THE CANCELLATION OF YOUR SURGERY PATIENT SIGNATURE_________________________________  NURSE SIGNATURE__________________________________  ________________________________________________________________________   Jonathan Bullock  An incentive spirometer is a tool that can help keep your lungs clear and active. This tool measures how well you are filling your lungs with each breath. Taking long deep breaths may help reverse or decrease the chance of developing breathing (pulmonary) problems (especially infection) following: A long period of time when you are unable to move or be active. BEFORE THE PROCEDURE  If the spirometer includes an indicator to show your best effort, your nurse or respiratory therapist will set it to a desired goal. If possible, sit up straight or lean slightly forward. Try not to slouch. Hold the incentive spirometer in an upright position. INSTRUCTIONS FOR USE  Sit on the edge of your bed if possible, or sit up as far as you can in bed or on a chair. Hold the incentive spirometer in an upright position. Breathe out normally. Place the mouthpiece in your mouth and seal your lips tightly around it. Breathe in slowly and as deeply as possible, raising the piston or the ball toward the top of the column. Hold your breath for 3-5 seconds or for as long as possible. Allow the piston or ball to fall to the bottom of the column. Remove the mouthpiece from your mouth and breathe out normally. Rest for a few seconds and repeat Steps  1 through 7 at least 10 times every 1-2 hours when you are awake. Take your time and take a few normal breaths between deep breaths. The spirometer may include an indicator to show your best effort. Use the indicator as a goal to work toward  during each repetition. After each set of 10 deep breaths, practice coughing to be sure your lungs are clear. If you have an incision (the cut made at the time of surgery), support your incision when coughing by placing a pillow or rolled up towels firmly against it. Once you are able to get out of bed, walk around indoors and cough well. You may stop using the incentive spirometer when instructed by your caregiver.  RISKS AND COMPLICATIONS Take your time so you do not get dizzy or light-headed. If you are in pain, you may need to take or ask for pain medication before doing incentive spirometry. It is harder to take a deep breath if you are having pain. AFTER USE Rest and breathe slowly and easily. It can be helpful to keep track of a log of your progress. Your caregiver can provide you with a simple table to help with this. If you are using the spirometer at home, follow these instructions: [place] IF:  You are having difficultly using the spirometer. You have trouble using the spirometer as often as instructed. Your pain medication is not giving enough relief while using the spirometer. You develop fever of 100.5 F (38.1 C) or higher. SEEK IMMEDIATE MEDICAL CARE IF:  You cough up bloody sputum that had not been present before. You develop fever of 102 F (38.9 C) or greater. You develop worsening pain at or near the incision site. MAKE SURE YOU:  Understand these instructions. Will watch your condition. Will get help right away if you are not doing well or get worse. Document Released: 09/17/2006 Document Revised: 07/30/2011 Document Reviewed: 11/18/2006 [facility] Patient Information 2014 [place], [place].   ________________________________________________________________________

## 2021-06-02 ENCOUNTER — Other Ambulatory Visit: Payer: Self-pay

## 2021-06-02 ENCOUNTER — Encounter (HOSPITAL_COMMUNITY): Payer: Self-pay

## 2021-06-02 ENCOUNTER — Encounter (HOSPITAL_COMMUNITY)
Admission: RE | Admit: 2021-06-02 | Discharge: 2021-06-02 | Disposition: A | Discharge status: Home / Self Care | Payer: Medicare Other | Source: Ambulatory Visit | Attending: Orthopedic Surgery | Admitting: Orthopedic Surgery

## 2021-06-02 VITALS — BP 152/79 | HR 58 | Temp 97.9°F | Resp 20 | Ht 71.0 in | Wt 200.0 lb

## 2021-06-02 DIAGNOSIS — E782 Mixed hyperlipidemia: Secondary | ICD-10-CM | POA: Insufficient documentation

## 2021-06-02 DIAGNOSIS — Z01818 Encounter for other preprocedural examination: Secondary | ICD-10-CM

## 2021-06-02 DIAGNOSIS — Z01812 Encounter for preprocedural laboratory examination: Secondary | ICD-10-CM | POA: Insufficient documentation

## 2021-06-02 DIAGNOSIS — I10 Essential (primary) hypertension: Secondary | ICD-10-CM | POA: Diagnosis not present

## 2021-06-02 LAB — CBC
HCT: 42.6 % (ref 39.0–52.0)
Hemoglobin: 14.4 g/dL (ref 13.0–17.0)
MCH: 33.3 pg (ref 26.0–34.0)
MCHC: 33.8 g/dL (ref 30.0–36.0)
MCV: 98.6 fL (ref 80.0–100.0)
Platelets: 260 10*3/uL (ref 150–400)
RBC: 4.32 MIL/uL (ref 4.22–5.81)
RDW: 12.2 % (ref 11.5–15.5)
WBC: 7.7 10*3/uL (ref 4.0–10.5)
nRBC: 0 % (ref 0.0–0.2)

## 2021-06-02 LAB — BASIC METABOLIC PANEL
Anion gap: 7 (ref 5–15)
BUN: 24 mg/dL — ABNORMAL HIGH (ref 8–23)
CO2: 26 mmol/L (ref 22–32)
Calcium: 9.1 mg/dL (ref 8.9–10.3)
Chloride: 103 mmol/L (ref 98–111)
Creatinine, Ser: 0.92 mg/dL (ref 0.61–1.24)
GFR, Estimated: >60 mL/min (ref >60)
Glucose, Bld: 127 mg/dL — ABNORMAL HIGH (ref 70–99)
Potassium: 4.4 mmol/L (ref 3.5–5.1)
Sodium: 136 mmol/L (ref 135–145)

## 2021-06-02 LAB — SURGICAL PCR SCREEN
MRSA, PCR: NEGATIVE
Staphylococcus aureus: NEGATIVE

## 2021-06-02 NOTE — Progress Notes (Signed)
COVID test- NA    PCP - Dr. Audie Pinto Cardiologist - Dr. Adora Fridge  Chest x-ray - no EKG - requested from dr. Pennie Banter office Stress Test -  ECHO -  Cardiac Cath - 2006,2010 Pacemaker/ICD device last checked:NA  Sleep Study - no CPAP -   Fasting Blood Sugar - NA Checks Blood Sugar _____ times a day  Blood Thinner Instructions:Plavix/ Dr. Gwenlyn Found Aspirin Instructions:none Pt will call Dr. Susie Cassette office Last Dose:  Anesthesia review: yes  Patient denies shortness of breath, fever, cough and chest pain at PAT appointment  Pt has no SOB with any activities.  Patient verbalized understanding of instructions that were given to them at the PAT appointment. Patient was also instructed that they will need to review over the PAT instructions again at home before surgery. yes

## 2021-06-08 NOTE — Progress Notes (Signed)
Anesthesia Chart Review   Case: [id number] Date/Time: 06/15/21 0945   Procedure: REVERSE SHOULDER ARTHROPLASTY (Right: Shoulder)   Anesthesia type: Choice   Pre-op diagnosis: Right shoulder osteoarthritis,ROTATOR CUFF TEAR ARTHROPATHY   Location: [facility] ROOM 06 / [facility]   Surgeons: Justice Britain, MD       DISCUSSION:80 y.o. some day smoker with h/o GERD, HTN, CAD, right shoulder OA scheduled for above procedure 06/15/2021 with Dr. Justice Britain.   DES to mid LAD 09/23/2008. Normal stress test 09/2011. He has since followed with PCP.  He was last seen 05/09/2021. Stable at this time, cleared for shoulder surgery. Per pt he was advised to hold Plavix 1 week prior to surgery.   Clearance from PCP on chart.   Anticipate pt can proceed with planned procedure barring acute status change.   VS: BP (!) 152/79    Pulse (!) 58    Temp 36.6 C (Oral)    Resp 20    Ht 5\' 11"  (1.803 m)    Wt 90.7 kg    SpO2 100%    BMI 27.89 kg/m   PROVIDERS: Deland Pretty, MD   LABS: Labs reviewed: Acceptable for surgery. (all labs ordered are listed, but only abnormal results are displayed)  Labs Reviewed  BASIC METABOLIC PANEL - Abnormal; Notable for the following components:      Result Value   Glucose, Bld 127 (*)    BUN 24 (*)    All other components within normal limits  SURGICAL PCR SCREEN  CBC     IMAGES:   EKG: On chart  CV:  Past Medical History:  Diagnosis Date   Barrett esophagus    With peptic ulcer disease and occasional GERD.   Bradycardia    Nuc Stress Test 09/27/11 revealed no scintigraphic evidence of inductible wall myocardial ischemia. Post-stress EF = 61%. No significant wall motion abnormalities noted. Exercise capacity 11 METS. EKG showed sinus bradycardia at 52. No exercise indued ischemic EKG changes noted. Normal myocardial perfusion study. Low risk scan.   Chest pain 09/2011   Nuc Stress Test 09/27/11 revealed no scintigraphic evidence of inductible wall myocardial ischemia.  Post-stress EF = 61%. No significant wall motion abnormalities noted. Exercise capacity 11 METS. EKG showed sinus bradycardia at 52. No exercise indued ischemic EKG changes noted. Normal myocardial perfusion study. Low risk scan.   Coronary artery disease    Carotid stent 2007/2010 x 3   Coronary artery disease    Nuc Stress Test 09/27/11 to evaluate the extent & severity of CAD, as well as other indications. Revealed no scintigraphic evidence of inductible wall myocardial ischemia. Post-stress EF = 61%. No significant wall motion abnormalities noted. Exercise capacity 11 METS. EKG showed sinus bradycardia at 52. No exercise indued ischemic EKG changes noted. Normal myocardial perfusion study. Low risk scan.   Diverticulosis of sigmoid colon    Findings: Diverticulosis of sigmoid colon, mild internal hemorrhoids, otherwise unremarkable exam.   GERD (gastroesophageal reflux disease)    H/O adenomatous polyp of colon    H/O atherosclerotic cardiovascular disease    Helicobacter positive gastritis    Treated in 02/2006   Hypercholesterolemia    Hyperlipidemia 5/5/1   Nuc Stress Test 09/22/08. Stress profile was positive for hyperlipidemia & hypertension. Dr. Shelia Media follows lipid profile.   Hypertension    Nuc Stress Test 09/22/08 for chest pain. Stress profile was positive for hyperlipidemia & hypertension.   Internal hemorrhoids    Findings: Diverticulosis of sigmoid colon, mild internal hemorrhoids,  otherwise unremarkable exam.   Peptic ulcer disease    Rosacea    Acne Rosacea   Ulcer 2876   Umbilical hernia 12/29/5724    Past Surgical History:  Procedure Laterality Date   ANAL FISTULECTOMY  1972/1974   CARDIAC CATHETERIZATION  09/23/2008   (Dr. Gwenlyn Found) Revealed patent stents with a 95% stenosis just beyond the distal edge of the mid LAD which was stented with a Xience V drug-eluting stent (3.0 x 12 mm). His remaining vessels were normal & EF was normal as well.   CARDIAC CATHETERIZATION   12/14/2004   (Dr. Phineas Inches high-grade stenosis across the second diagonal,ostial DX to stenosis& tandem proximal-mid LAD stenosis.Well-preserved LV function.No significant right or circumflex disease.   CAROTID STENT  2007/2010   x3   COLONOSCOPY     Findings: Diverticulosis of sigmoid colon, mild internal hemorrhoids, otherwise unremarkable exam.   CORONARY ANGIOPLASTY WITH STENT PLACEMENT  09/23/2008   (Dr. Gwenlyn Found) Cath revealed patent stents with a 95% stenosis just beyond the distal edge of the mid LAD which was stented with a Xience V drug-eluting stent (3.0 x 12 mm). His remaining vessels were normal & EF was normal as well.   CORONARY ANGIOPLASTY WITH STENT PLACEMENT  12/19/2004   (Dr. Rollene Fare) Stents x 2 placed in his mid LAD.   KNEE ARTHROSCOPY Right 1997   Dr. Onnie Graham   MOLE REMOVAL  09/1999   Dr. Wilhemina Bonito. Aberrant mole removal of chest    UMBILICAL HERNIA REPAIR  10/19/2011   HERNIA REPAIR UMBILICAL ADULT;  Surgeon: Imogene Burn. Tsuei, MD;  Location: [place];  Service: General;  Laterality: N/A;  Umbilical hernia repair with mesh    MEDICATIONS:  acebutolol (SECTRAL) 200 MG capsule   atorvastatin (LIPITOR) 20 MG tablet   clopidogrel (PLAVIX) 75 MG tablet   metroNIDAZOLE (METROCREAM) 0.75 % cream   Misc Natural Products (GLUCOSAMINE CHOND MSM FORMULA PO)   Multiple Vitamins-Minerals (MULTIVITAMIN PO)   nitroGLYCERIN (NITROSTAT) 0.4 MG SL tablet   pantoprazole (PROTONIX) 40 MG tablet   tamsulosin (FLOMAX) 0.4 MG CAPS capsule   vitamin B-12 (CYANOCOBALAMIN) 1000 MCG tablet   No current facility-administered medications for this encounter.     Jonathan Felix Ward, PA-C [facility] [phone]

## 2021-06-15 ENCOUNTER — Other Ambulatory Visit: Payer: Self-pay

## 2021-06-15 ENCOUNTER — Ambulatory Visit (HOSPITAL_COMMUNITY): Payer: Medicare Other | Admitting: Physician Assistant

## 2021-06-15 ENCOUNTER — Encounter (HOSPITAL_COMMUNITY): Payer: Self-pay | Admitting: Orthopedic Surgery

## 2021-06-15 ENCOUNTER — Encounter (HOSPITAL_COMMUNITY)
Admission: RE | Disposition: A | Discharge status: Home / Self Care | Payer: Self-pay | Source: Ambulatory Visit | Attending: Orthopedic Surgery

## 2021-06-15 ENCOUNTER — Ambulatory Visit (HOSPITAL_COMMUNITY): Payer: Medicare Other | Admitting: Anesthesiology

## 2021-06-15 ENCOUNTER — Ambulatory Visit (HOSPITAL_COMMUNITY)
Admission: RE | Admit: 2021-06-15 | Discharge: 2021-06-15 | Disposition: A | Discharge status: Home / Self Care | Payer: Medicare Other | Source: Ambulatory Visit | Attending: Orthopedic Surgery | Admitting: Orthopedic Surgery

## 2021-06-15 DIAGNOSIS — M75101 Unspecified rotator cuff tear or rupture of right shoulder, not specified as traumatic: Secondary | ICD-10-CM | POA: Insufficient documentation

## 2021-06-15 DIAGNOSIS — F17290 Nicotine dependence, other tobacco product, uncomplicated: Secondary | ICD-10-CM | POA: Insufficient documentation

## 2021-06-15 DIAGNOSIS — K219 Gastro-esophageal reflux disease without esophagitis: Secondary | ICD-10-CM | POA: Diagnosis not present

## 2021-06-15 DIAGNOSIS — Z79899 Other long term (current) drug therapy: Secondary | ICD-10-CM | POA: Insufficient documentation

## 2021-06-15 DIAGNOSIS — Z955 Presence of coronary angioplasty implant and graft: Secondary | ICD-10-CM | POA: Diagnosis not present

## 2021-06-15 DIAGNOSIS — F17210 Nicotine dependence, cigarettes, uncomplicated: Secondary | ICD-10-CM | POA: Insufficient documentation

## 2021-06-15 DIAGNOSIS — I2510 Atherosclerotic heart disease of native coronary artery without angina pectoris: Secondary | ICD-10-CM | POA: Insufficient documentation

## 2021-06-15 DIAGNOSIS — M19011 Primary osteoarthritis, right shoulder: Secondary | ICD-10-CM | POA: Insufficient documentation

## 2021-06-15 DIAGNOSIS — Z7902 Long term (current) use of antithrombotics/antiplatelets: Secondary | ICD-10-CM | POA: Insufficient documentation

## 2021-06-15 DIAGNOSIS — I10 Essential (primary) hypertension: Secondary | ICD-10-CM

## 2021-06-15 HISTORY — PX: REVERSE SHOULDER ARTHROPLASTY: SHX5054

## 2021-06-15 LAB — BASIC METABOLIC PANEL
Anion gap: 8 (ref 5–15)
BUN: 18 mg/dL (ref 8–23)
CO2: 23 mmol/L (ref 22–32)
Calcium: 8.5 mg/dL — ABNORMAL LOW (ref 8.9–10.3)
Chloride: 106 mmol/L (ref 98–111)
Creatinine, Ser: 0.92 mg/dL (ref 0.61–1.24)
GFR, Estimated: >60 mL/min (ref >60)
Glucose, Bld: 106 mg/dL — ABNORMAL HIGH (ref 70–99)
Potassium: 4.4 mmol/L (ref 3.5–5.1)
Sodium: 137 mmol/L (ref 135–145)

## 2021-06-15 LAB — CBC
HCT: 39.9 % (ref 39.0–52.0)
Hemoglobin: 13.3 g/dL (ref 13.0–17.0)
MCH: 33.6 pg (ref 26.0–34.0)
MCHC: 33.3 g/dL (ref 30.0–36.0)
MCV: 100.8 fL — ABNORMAL HIGH (ref 80.0–100.0)
Platelets: 187 10*3/uL (ref 150–400)
RBC: 3.96 MIL/uL — ABNORMAL LOW (ref 4.22–5.81)
RDW: 12.3 % (ref 11.5–15.5)
WBC: 11.5 10*3/uL — ABNORMAL HIGH (ref 4.0–10.5)
nRBC: 0 % (ref 0.0–0.2)

## 2021-06-15 SURGERY — ARTHROPLASTY, SHOULDER, TOTAL, REVERSE
Anesthesia: General | Site: Shoulder | Laterality: Right

## 2021-06-15 MED ORDER — OXYCODONE HCL 5 MG/5ML PO SOLN: 5.0000 mg | Freq: Once | ORAL | Status: DC | PRN

## 2021-06-15 MED ORDER — LACTATED RINGERS IV BOLUS
500.0000 mL | Freq: Once | INTRAVENOUS | Status: AC
  Administered 2021-06-15: 500 mL via INTRAVENOUS

## 2021-06-15 MED ORDER — LACTATED RINGERS IV SOLN: INTRAVENOUS | Status: DC

## 2021-06-15 MED ORDER — ORAL CARE MOUTH RINSE: 15.0000 mL | Freq: Once | OROMUCOSAL | Status: AC

## 2021-06-15 MED ORDER — PROPOFOL 10 MG/ML IV BOLUS
INTRAVENOUS | Status: AC
  Filled 2021-06-15: qty 20

## 2021-06-15 MED ORDER — CEFAZOLIN SODIUM-DEXTROSE 2-4 GM/100ML-% IV SOLN: 2.0000 g | INTRAVENOUS | Status: DC

## 2021-06-15 MED ORDER — ONDANSETRON HCL 4 MG/2ML IJ SOLN
INTRAMUSCULAR | Status: DC | PRN
  Administered 2021-06-15: 4 mg via INTRAVENOUS

## 2021-06-15 MED ORDER — EPHEDRINE SULFATE-NACL 50-0.9 MG/10ML-% IV SOSY
PREFILLED_SYRINGE | INTRAVENOUS | Status: DC | PRN
  Administered 2021-06-15: 10 mg via INTRAVENOUS
  Administered 2021-06-15: 15 mg via INTRAVENOUS
  Administered 2021-06-15 (×2): 10 mg via INTRAVENOUS

## 2021-06-15 MED ORDER — VANCOMYCIN HCL 1000 MG IV SOLR
INTRAVENOUS | Status: AC
  Filled 2021-06-15: qty 20

## 2021-06-15 MED ORDER — VANCOMYCIN HCL 1000 MG IV SOLR
INTRAVENOUS | Status: DC | PRN
  Administered 2021-06-15: 1000 mg

## 2021-06-15 MED ORDER — BUPIVACAINE HCL (PF) 0.5 % IJ SOLN
INTRAMUSCULAR | Status: DC | PRN
  Administered 2021-06-15: 15 mL via PERINEURAL

## 2021-06-15 MED ORDER — MIDAZOLAM HCL 2 MG/2ML IJ SOLN
1.0000 mg | INTRAMUSCULAR | Status: DC
  Filled 2021-06-15: qty 2

## 2021-06-15 MED ORDER — PHENYLEPHRINE 40 MCG/ML (10ML) SYRINGE FOR IV PUSH (FOR BLOOD PRESSURE SUPPORT)
PREFILLED_SYRINGE | INTRAVENOUS | Status: DC | PRN
  Administered 2021-06-15 (×4): 80 ug via INTRAVENOUS

## 2021-06-15 MED ORDER — CEFAZOLIN SODIUM-DEXTROSE 2-4 GM/100ML-% IV SOLN
2.0000 g | INTRAVENOUS | Status: AC
  Administered 2021-06-15: 2 g via INTRAVENOUS
  Filled 2021-06-15: qty 100

## 2021-06-15 MED ORDER — FENTANYL CITRATE PF 50 MCG/ML IJ SOSY
50.0000 ug | PREFILLED_SYRINGE | INTRAMUSCULAR | Status: DC
  Administered 2021-06-15: 50 ug via INTRAVENOUS
  Filled 2021-06-15: qty 2

## 2021-06-15 MED ORDER — ONDANSETRON HCL 4 MG/2ML IJ SOLN: 4.0000 mg | Freq: Once | INTRAMUSCULAR | Status: DC | PRN

## 2021-06-15 MED ORDER — ACETAMINOPHEN 500 MG PO TABS
1000.0000 mg | ORAL_TABLET | Freq: Once | ORAL | Status: AC
  Administered 2021-06-15: 1000 mg via ORAL
  Filled 2021-06-15: qty 2

## 2021-06-15 MED ORDER — 0.9 % SODIUM CHLORIDE (POUR BTL) OPTIME
TOPICAL | Status: DC | PRN
  Administered 2021-06-15: 1000 mL

## 2021-06-15 MED ORDER — PROPOFOL 10 MG/ML IV BOLUS
INTRAVENOUS | Status: DC | PRN
Start: 2021-06-15 — End: 2021-06-15
  Administered 2021-06-15: 170 mg via INTRAVENOUS

## 2021-06-15 MED ORDER — ONDANSETRON HCL 4 MG/2ML IJ SOLN
INTRAMUSCULAR | Status: AC
  Filled 2021-06-15: qty 2

## 2021-06-15 MED ORDER — TIZANIDINE HCL 4 MG PO TABS: 4.0000 mg | ORAL_TABLET | Freq: Every day | ORAL | 1 refills | Status: AC

## 2021-06-15 MED ORDER — TRANEXAMIC ACID-NACL 1000-0.7 MG/100ML-% IV SOLN
1000.0000 mg | INTRAVENOUS | Status: AC
  Administered 2021-06-15: 1000 mg via INTRAVENOUS
  Filled 2021-06-15: qty 100

## 2021-06-15 MED ORDER — ONDANSETRON HCL 4 MG PO TABS: 4.0000 mg | ORAL_TABLET | Freq: Three times a day (TID) | ORAL | 0 refills | Status: AC | PRN

## 2021-06-15 MED ORDER — BUPIVACAINE LIPOSOME 1.3 % IJ SUSP
INTRAMUSCULAR | Status: DC | PRN
  Administered 2021-06-15: 10 mL via PERINEURAL

## 2021-06-15 MED ORDER — GLYCOPYRROLATE 0.2 MG/ML IJ SOLN
INTRAMUSCULAR | Status: AC
  Filled 2021-06-15: qty 1

## 2021-06-15 MED ORDER — GLYCOPYRROLATE 0.2 MG/ML IJ SOLN
INTRAMUSCULAR | Status: DC | PRN
  Administered 2021-06-15: .1 mg via INTRAVENOUS

## 2021-06-15 MED ORDER — LIDOCAINE 2% (20 MG/ML) 5 ML SYRINGE
INTRAMUSCULAR | Status: DC | PRN
  Administered 2021-06-15: 50 mg via INTRAVENOUS

## 2021-06-15 MED ORDER — FENTANYL CITRATE PF 50 MCG/ML IJ SOSY: 25.0000 ug | PREFILLED_SYRINGE | INTRAMUSCULAR | Status: DC | PRN

## 2021-06-15 MED ORDER — DEXAMETHASONE SODIUM PHOSPHATE 10 MG/ML IJ SOLN
INTRAMUSCULAR | Status: AC
  Filled 2021-06-15: qty 1

## 2021-06-15 MED ORDER — DEXAMETHASONE SODIUM PHOSPHATE 10 MG/ML IJ SOLN
INTRAMUSCULAR | Status: DC | PRN
  Administered 2021-06-15: 10 mg via INTRAVENOUS

## 2021-06-15 MED ORDER — OXYCODONE HCL 5 MG PO TABS: 5.0000 mg | ORAL_TABLET | Freq: Once | ORAL | Status: DC | PRN

## 2021-06-15 MED ORDER — OXYCODONE-ACETAMINOPHEN 5-325 MG PO TABS: 1.0000 | ORAL_TABLET | ORAL | 0 refills | Status: AC | PRN

## 2021-06-15 MED ORDER — LACTATED RINGERS IV BOLUS
250.0000 mL | Freq: Once | INTRAVENOUS | Status: AC
  Administered 2021-06-15: 250 mL via INTRAVENOUS

## 2021-06-15 MED ORDER — TRANEXAMIC ACID 1000 MG/10ML IV SOLN: 1000.0000 mg | INTRAVENOUS | Status: DC

## 2021-06-15 MED ORDER — ROCURONIUM BROMIDE 10 MG/ML (PF) SYRINGE
PREFILLED_SYRINGE | INTRAVENOUS | Status: DC | PRN
  Administered 2021-06-15 (×2): 50 mg via INTRAVENOUS

## 2021-06-15 MED ORDER — EPHEDRINE 5 MG/ML INJ
INTRAVENOUS | Status: AC
  Filled 2021-06-15: qty 10

## 2021-06-15 MED ORDER — CHLORHEXIDINE GLUCONATE 0.12 % MT SOLN
15.0000 mL | Freq: Once | OROMUCOSAL | Status: AC
  Administered 2021-06-15: 15 mL via OROMUCOSAL

## 2021-06-15 MED ORDER — STERILE WATER FOR IRRIGATION IR SOLN
Status: DC | PRN
  Administered 2021-06-15: 2000 mL

## 2021-06-15 SURGICAL SUPPLY — 78 items
ADH SKN CLS APL DERMABOND .7 (GAUZE/BANDAGES/DRESSINGS) ×1
AID PSTN UNV HD RSTRNT DISP (MISCELLANEOUS) ×1
BAG COUNTER SPONGE SURGICOUNT (BAG) ×1 IMPLANT
BAG SPEC THK2 15X12 ZIP CLS (MISCELLANEOUS) ×1
BAG SPNG CNTER NS LX DISP (BAG) ×1
BAG ZIPLOCK 12X15 (MISCELLANEOUS) ×2 IMPLANT
BIT DRILL FLUTED 3.0 STRL (BIT) ×1 IMPLANT
BLADE SAW SGTL 83.5X18.5 (BLADE) ×2 IMPLANT
BNDG COHESIVE 4X5 TAN ST LF (GAUZE/BANDAGES/DRESSINGS) ×2 IMPLANT
BSPLAT GLND +2X24 MDLR (Joint) ×1 IMPLANT
COOLER ICEMAN CLASSIC (MISCELLANEOUS) ×2 IMPLANT
COVER BACK TABLE 60X90IN (DRAPES) ×2 IMPLANT
COVER SURGICAL LIGHT HANDLE (MISCELLANEOUS) ×2 IMPLANT
CUP SUT UNIV REVERS 42 NEUT (Shoulder) ×1 IMPLANT
DERMABOND ADVANCED (GAUZE/BANDAGES/DRESSINGS) ×1
DERMABOND ADVANCED .7 DNX12 (GAUZE/BANDAGES/DRESSINGS) ×1 IMPLANT
DRAPE INCISE IOBAN 66X45 STRL (DRAPES) IMPLANT
DRAPE ORTHO SPLIT 77X108 STRL (DRAPES) ×4
DRAPE SHEET LG 3/4 BI-LAMINATE (DRAPES) ×2 IMPLANT
DRAPE SURG 17X11 SM STRL (DRAPES) ×2 IMPLANT
DRAPE SURG ORHT 6 SPLT 77X108 (DRAPES) ×2 IMPLANT
DRAPE TOP 10253 STERILE (DRAPES) ×2 IMPLANT
DRAPE U-SHAPE 47X51 STRL (DRAPES) ×2 IMPLANT
DRESSING AQUACEL AG SP 3.5X6 (GAUZE/BANDAGES/DRESSINGS) ×1 IMPLANT
DRSG AQUACEL AG ADV 3.5X 6 (GAUZE/BANDAGES/DRESSINGS) ×1 IMPLANT
DRSG AQUACEL AG ADV 3.5X10 (GAUZE/BANDAGES/DRESSINGS) IMPLANT
DRSG AQUACEL AG SP 3.5X6 (GAUZE/BANDAGES/DRESSINGS) ×2
DRSG TEGADERM 8X12 (GAUZE/BANDAGES/DRESSINGS) ×2 IMPLANT
DURAPREP 26ML APPLICATOR (WOUND CARE) ×2 IMPLANT
ELECT BLADE TIP CTD 4 INCH (ELECTRODE) ×2 IMPLANT
ELECT PENCIL ROCKER SW 15FT (MISCELLANEOUS) ×2 IMPLANT
ELECT REM PT RETURN 15FT ADLT (MISCELLANEOUS) ×2 IMPLANT
FACESHIELD WRAPAROUND (MASK) ×8 IMPLANT
FACESHIELD WRAPAROUND OR TEAM (MASK) ×4 IMPLANT
GLENOID SYS 42 +4 LAT/24 SHLDR (Miscellaneous) ×1 IMPLANT
GLENOID UNI REV MOD 24 +2 LAT (Joint) ×1 IMPLANT
GLOVE SRG 8 PF TXTR STRL LF DI (GLOVE) ×1 IMPLANT
GLOVE SURG ENC MOIS LTX SZ7 (GLOVE) ×2 IMPLANT
GLOVE SURG ENC MOIS LTX SZ7.5 (GLOVE) ×2 IMPLANT
GLOVE SURG UNDER POLY LF SZ7 (GLOVE) ×2 IMPLANT
GLOVE SURG UNDER POLY LF SZ8 (GLOVE) ×2
GOWN STRL REUS W/TWL LRG LVL3 (GOWN DISPOSABLE) ×4 IMPLANT
INSERT HUMERAL LRG 42/+6 (Miscellaneous) ×1 IMPLANT
KIT BASIN OR (CUSTOM PROCEDURE TRAY) ×2 IMPLANT
KIT TURNOVER KIT A (KITS) ×1 IMPLANT
MANIFOLD NEPTUNE II (INSTRUMENTS) ×2 IMPLANT
NDL TAPERED W/ NITINOL LOOP (MISCELLANEOUS) ×1 IMPLANT
NEEDLE TAPERED W/ NITINOL LOOP (MISCELLANEOUS) ×2 IMPLANT
NS IRRIG 1000ML POUR BTL (IV SOLUTION) ×2 IMPLANT
PACK SHOULDER (CUSTOM PROCEDURE TRAY) ×2 IMPLANT
PAD ARMBOARD 7.5X6 YLW CONV (MISCELLANEOUS) ×2 IMPLANT
PAD COLD SHLDR WRAP-ON (PAD) ×2 IMPLANT
PIN NITINOL TARGETER 2.8 (PIN) IMPLANT
PIN SET MODULAR GLENOID SYSTEM (PIN) ×1 IMPLANT
RESTRAINT HEAD UNIVERSAL NS (MISCELLANEOUS) ×2 IMPLANT
SCREW CENTRAL MOD 30MM (Screw) ×1 IMPLANT
SCREW PERI LOCK 5.5X24 (Screw) ×2 IMPLANT
SCREW PERI LOCK 5.5X32 (Screw) ×1 IMPLANT
SCREW PERIPHERAL 5.5X20 LOCK (Screw) ×1 IMPLANT
SLING ARM FOAM STRAP LRG (SOFTGOODS) ×1 IMPLANT
SLING ARM FOAM STRAP MED (SOFTGOODS) IMPLANT
SPONGE T-LAP 18X18 ~~LOC~~+RFID (SPONGE) ×2 IMPLANT
SPONGE T-LAP 4X18 ~~LOC~~+RFID (SPONGE) ×2 IMPLANT
STEM HUMERAL UNI REVERSE SZ10 (Stem) ×1 IMPLANT
SUCTION FRAZIER HANDLE 12FR (TUBING) ×2
SUCTION TUBE FRAZIER 12FR DISP (TUBING) ×1 IMPLANT
SUT FIBERWIRE #2 38 T-5 BLUE (SUTURE) ×4
SUT MNCRL AB 3-0 PS2 18 (SUTURE) ×2 IMPLANT
SUT MON AB 2-0 CT1 36 (SUTURE) ×2 IMPLANT
SUT VIC AB 1 CT1 36 (SUTURE) ×2 IMPLANT
SUTURE FIBERWR #2 38 T-5 BLUE (SUTURE) IMPLANT
SUTURE TAPE 1.3 40 TPR END (SUTURE) ×2 IMPLANT
SUTURETAPE 1.3 40 TPR END (SUTURE) ×4
TOWEL OR 17X26 10 PK STRL BLUE (TOWEL DISPOSABLE) ×2 IMPLANT
TOWEL OR NON WOVEN STRL DISP B (DISPOSABLE) ×2 IMPLANT
TUBE SUCTION HIGH CAP CLEAR NV (SUCTIONS) ×2 IMPLANT
WATER STERILE IRR 1000ML POUR (IV SOLUTION) ×4 IMPLANT
YANKAUER SUCT BULB TIP 10FT TU (MISCELLANEOUS) ×2 IMPLANT

## 2021-06-15 NOTE — Anesthesia Postprocedure Evaluation (Signed)
Anesthesia Post Note  Patient: Jonathan Bullock  Procedure(s) Performed: REVERSE SHOULDER ARTHROPLASTY (Right: Shoulder)     Patient location during evaluation: PACU Anesthesia Type: General Level of consciousness: awake and alert Pain management: pain level controlled Vital Signs Assessment: post-procedure vital signs reviewed and stable Respiratory status: spontaneous breathing, nonlabored ventilation and respiratory function stable Cardiovascular status: stable and blood pressure returned to baseline Anesthetic complications: no   No notable events documented.  Last Vitals:  Vitals:   06/15/21 1230 06/15/21 1245  BP: 133/76 134/75  Pulse: (!) 51   Resp: 16   Temp:  36.6 C  SpO2: 97%     Last Pain:  Vitals:   06/15/21 1245  TempSrc:   PainSc: 0-No pain                 Audry Pili

## 2021-06-15 NOTE — Discharge Instructions (Signed)

## 2021-06-15 NOTE — Anesthesia Procedure Notes (Signed)
Anesthesia Regional Block: Interscalene brachial plexus block   Pre-Anesthetic Checklist: , timeout performed,  Correct Patient, Correct Site, Correct Laterality,  Correct Procedure, Correct Position, site marked,  Risks and benefits discussed,  Surgical consent,  Pre-op evaluation,  At surgeon's request and post-op pain management  Laterality: Right  Prep: chloraprep       Needles:  Injection technique: Single-shot  Needle Type: Echogenic Needle     Needle Length: 5cm  Needle Gauge: 21     Additional Needles:   Narrative:  Start time: 06/15/2021 9:11 AM End time: 06/15/2021 9:14 AM Injection made incrementally with aspirations every 5 mL.  Performed by: Personally  Anesthesiologist: Audry Pili, MD  Additional Notes: No pain on injection. No increased resistance to injection. Injection made in 5cc increments. Good needle visualization. Patient tolerated the procedure well.

## 2021-06-15 NOTE — Evaluation (Signed)
Occupational Therapy Evaluation Patient Details Name: Jonathan Bullock MRN: [id number] DOB: 1940-05-29 Today's Date: 06/15/2021   History of Present Illness Patient is an 81 year old male s/p R reverse total shoulder arthroplasty. PMH: CAD, kne arthroscopy   Clinical Impression   Patient is an 81 year old male s/p shoulder replacement without functional use of right dominant upper extremity secondary to effects of surgery and interscalene block and shoulder precautions. Therapist provided education and instruction to patient and spouse in regards to exercises, precautions, positioning, donning upper extremity clothing and bathing while maintaining shoulder precautions, ice and edema management and donning/doffing sling. Patient and spouse verbalized understanding and demonstrated as needed. Patient needed assistance to donn shirt, underwear, pants, socks and shoes and provided with instruction on compensatory strategies to perform ADLs. Patient to follow up with MD for further therapy needs.        Recommendations for follow up therapy are one component of a multi-disciplinary discharge planning process, led by the attending physician.  Recommendations may be updated based on patient status, additional functional criteria and insurance authorization.   Follow Up Recommendations  Follow physician's recommendations for discharge plan and follow up therapies    Assistance Recommended at Discharge Intermittent Supervision/Assistance  Patient can return home with the following A little help with walking and/or transfers;A lot of help with bathing/dressing/bathroom    Functional Status Assessment  Patient has had a recent decline in their functional status and demonstrates the ability to make significant improvements in function in a reasonable and predictable amount of time.  Equipment Recommendations  None recommended by OT       Precautions / Restrictions Precautions Precautions:  Shoulder Type of Shoulder Precautions: If sitting in controlled environment, ok to come out of sling to give neck a break. Please sleep in it to protect until follow up in office. OK to use operative arm for feeding, hygiene and ADLs. Ok to instruct Pendulums and lap slides as exercises. Ok to use operative arm within the following parameters for ADL purposes     Ok for PROM, AAROM, AROM within pain tolerance and within the following ROM   ER 20   ABD 45   FE 60. AROM elbow, wrist, hand ok Shoulder Interventions: Shoulder sling/immobilizer;Off for dressing/bathing/exercises Precaution Booklet Issued: Yes (comment) Required Braces or Orthoses: Sling Restrictions Weight Bearing Restrictions: Yes RUE Weight Bearing: Non weight bearing             ADL either performed or assessed with clinical judgement   ADL Overall ADL's : Needs assistance/impaired Eating/Feeding: Independent   Grooming: Independent   Upper Body Bathing: Minimal assistance   Lower Body Bathing: Moderate assistance;Sitting/lateral leans;Sit to/from stand   Upper Body Dressing : Moderate assistance;Cueing for UE precautions;Cueing for sequencing;Standing Upper Body Dressing Details (indicate cue type and reason): cues for spouse to assist threading R UE and pulling shirt overhwead Lower Body Dressing: Moderate assistance;Sit to/from stand;Sitting/lateral leans Lower Body Dressing Details (indicate cue type and reason): to initiate threading LEs and pulling up over R hip Toilet Transfer: Min guard;Ambulation   Toileting- Clothing Manipulation and Hygiene: Minimal assistance;Sitting/lateral lean;Sit to/from stand       Functional mobility during ADLs: Min guard General ADL Comments: patient and spouse educated in shoulder precautions and how to maintain during self care      Pertinent Vitals/Pain Pain Assessment Pain Assessment: Faces Faces Pain Scale: Hurts a little bit Pain Location: R UE Pain Descriptors /  Indicators: Heaviness, Numbness Pain Intervention(s):  Monitored during session     Hand Dominance Right   Extremity/Trunk Assessment Upper Extremity Assessment Upper Extremity Assessment: RUE deficits/detail RUE Deficits / Details: + nerve block   Lower Extremity Assessment Lower Extremity Assessment: Overall WFL for tasks assessed   Cervical / Trunk Assessment Cervical / Trunk Assessment: Normal   Communication Communication Communication: No difficulties   Cognition Arousal/Alertness: Awake/alert Behavior During Therapy: WFL for tasks assessed/performed Overall Cognitive Status: Within Functional Limits for tasks assessed                                          Exercises Exercises: Shoulder   Shoulder Instructions Shoulder Instructions Donning/doffing shirt without moving shoulder: Moderate assistance;Caregiver independent with task;Patient able to independently direct caregiver Method for sponge bathing under operated UE: Minimal assistance;Caregiver independent with task;Patient able to independently direct caregiver Donning/doffing sling/immobilizer: Moderate assistance;Caregiver independent with task;Patient able to independently direct caregiver Correct positioning of sling/immobilizer: Minimal assistance;Caregiver independent with task;Patient able to independently direct caregiver Pendulum exercises (written home exercise program): Caregiver independent with task;Patient able to independently direct caregiver ROM for elbow, wrist and digits of operated UE: Caregiver independent with task;Patient able to independently direct caregiver Sling wearing schedule (on at all times/off for ADL's): Caregiver independent with task;Patient able to independently direct caregiver Proper positioning of operated UE when showering: Caregiver independent with task;Patient able to independently direct caregiver Positioning of UE while sleeping: Caregiver independent with  task;Patient able to independently direct caregiver    Home Living Family/patient expects to be discharged to:: Private residence Living Arrangements: Spouse/significant other Available Help at Discharge: Family Type of Home: House Home Access: Stairs to enter Technical brewer of Steps: 4   Home Layout: One level     Bathroom Shower/Tub: Occupational psychologist: Handicapped height (3N1 over commode)     Home Equipment: BSC/3in1          Prior Functioning/Environment Prior Level of Function : Independent/Modified Independent                        OT Problem List: Pain;Impaired UE functional use;Decreased knowledge of precautions         OT Goals(Current goals can be found in the care plan section) Acute Rehab OT Goals Patient Stated Goal: Home OT Goal Formulation: All assessment and education complete, DC therapy   AM-PAC OT "6 Clicks" Daily Activity     Outcome Measure Help from another person eating meals?: None Help from another person taking care of personal grooming?: None Help from another person toileting, which includes using toliet, bedpan, or urinal?: A Little Help from another person bathing (including washing, rinsing, drying)?: A Lot Help from another person to put on and taking off regular upper body clothing?: A Lot Help from another person to put on and taking off regular lower body clothing?: A Lot 6 Click Score: 17   End of Session Nurse Communication: Other (comment) (OT complete)  Activity Tolerance: Patient tolerated treatment well Patient left: in chair;with call bell/phone within reach;with family/visitor present  OT Visit Diagnosis: Pain Pain - Right/Left: Right Pain - part of body: Shoulder                Time: 3382-5053 OT Time Calculation (min): 35 min Charges:  OT General Charges $OT Visit: 1 Visit OT Evaluation $OT Eval Low Complexity: 1 Low  OT Treatments $Self Care/Home Management : 8-22 mins  Delbert Phenix OT OT pager: [phone]   Rosemary Holms 06/15/2021, 2:55 PM

## 2021-06-15 NOTE — Anesthesia Procedure Notes (Signed)
Procedure Name: Intubation Date/Time: 06/15/2021 10:12 AM Performed by: Gerald Leitz, CRNA Pre-anesthesia Checklist: Patient identified, Patient being monitored, Timeout performed, Emergency Drugs available and Suction available Patient Re-evaluated:Patient Re-evaluated prior to induction Oxygen Delivery Method: Circle system utilized Preoxygenation: Pre-oxygenation with 100% oxygen Induction Type: IV induction Ventilation: Mask ventilation without difficulty Laryngoscope Size: Mac, Glidescope and 4 Grade View: Grade I Tube type: Oral Tube size: 7.5 mm Number of attempts: 2 Airway Equipment and Method: Stylet Placement Confirmation: ETT inserted through vocal cords under direct vision, positive ETCO2 and breath sounds checked- equal and bilateral Secured at: 24 cm Tube secured with: Tape Dental Injury: Teeth and Oropharynx as per pre-operative assessment  Difficulty Due To: Difficult Airway- due to anterior larynx Comments: Recommend using Glidescope #4, Long oropharyngeal spacing to vocal cords

## 2021-06-15 NOTE — Progress Notes (Signed)
Assisted Dr. Brock with right, ultrasound guided, interscalene  block. Side rails up, monitors on throughout procedure. See vital signs in flow sheet. Tolerated Procedure well.  

## 2021-06-15 NOTE — Transfer of Care (Signed)
Immediate Anesthesia Transfer of Care Note  Patient: Jonathan Bullock  Procedure(s) Performed: Procedure(s): REVERSE SHOULDER ARTHROPLASTY (Right)  Patient Location: PACU  Anesthesia Type:General  Level of Consciousness: Alert, Awake, Oriented  Airway & Oxygen Therapy: Patient Spontanous Breathing  Post-op Assessment: Report given to RN  Post vital signs: Reviewed and stable  Last Vitals:  Vitals:   06/15/21 0913 06/15/21 0918  BP: (!) 169/81   Pulse: (!) 51 (!) 49  Resp: 15 13  Temp:    SpO2: 200% 941%    Complications: No apparent anesthesia complications

## 2021-06-15 NOTE — H&P (Signed)
Jonathan Bullock    Chief Complaint: Right shoulder osteoarthritis,ROTATOR CUFF TEAR ARTHROPATHY HPI: The patient is a 81 y.o. male with chronic right shoulder pain related to severe arthritis and chronic rotator cuff tear.  Due to his increasing functional imitations and failure to respond to prolonged attempts at conservative management, he is brought to the operating room at this time for planned right shoulder reverse arthroplasty  Past Medical History:  Diagnosis Date   Barrett esophagus    With peptic ulcer disease and occasional GERD.   Bradycardia    Nuc Stress Test 09/27/11 revealed no scintigraphic evidence of inductible wall myocardial ischemia. Post-stress EF = 61%. No significant wall motion abnormalities noted. Exercise capacity 11 METS. EKG showed sinus bradycardia at 52. No exercise indued ischemic EKG changes noted. Normal myocardial perfusion study. Low risk scan.   Chest pain 09/2011   Nuc Stress Test 09/27/11 revealed no scintigraphic evidence of inductible wall myocardial ischemia. Post-stress EF = 61%. No significant wall motion abnormalities noted. Exercise capacity 11 METS. EKG showed sinus bradycardia at 52. No exercise indued ischemic EKG changes noted. Normal myocardial perfusion study. Low risk scan.   Coronary artery disease    Carotid stent 2007/2010 x 3   Coronary artery disease    Nuc Stress Test 09/27/11 to evaluate the extent & severity of CAD, as well as other indications. Revealed no scintigraphic evidence of inductible wall myocardial ischemia. Post-stress EF = 61%. No significant wall motion abnormalities noted. Exercise capacity 11 METS. EKG showed sinus bradycardia at 52. No exercise indued ischemic EKG changes noted. Normal myocardial perfusion study. Low risk scan.   Diverticulosis of sigmoid colon    Findings: Diverticulosis of sigmoid colon, mild internal hemorrhoids, otherwise unremarkable exam.   GERD (gastroesophageal reflux disease)    H/O adenomatous polyp  of colon    H/O atherosclerotic cardiovascular disease    Helicobacter positive gastritis    Treated in 02/2006   Hypercholesterolemia    Hyperlipidemia 5/5/1   Nuc Stress Test 09/22/08. Stress profile was positive for hyperlipidemia & hypertension. Dr. Shelia Media follows lipid profile.   Hypertension    Nuc Stress Test 09/22/08 for chest pain. Stress profile was positive for hyperlipidemia & hypertension.   Internal hemorrhoids    Findings: Diverticulosis of sigmoid colon, mild internal hemorrhoids, otherwise unremarkable exam.   Peptic ulcer disease    Rosacea    Acne Rosacea   Ulcer 3474   Umbilical hernia 2/59/5638    Past Surgical History:  Procedure Laterality Date   ANAL FISTULECTOMY  1972/1974   CARDIAC CATHETERIZATION  09/23/2008   (Dr. Gwenlyn Found) Revealed patent stents with a 95% stenosis just beyond the distal edge of the mid LAD which was stented with a Xience V drug-eluting stent (3.0 x 12 mm). His remaining vessels were normal & EF was normal as well.   CARDIAC CATHETERIZATION  12/14/2004   (Dr. Phineas Inches high-grade stenosis across the second diagonal,ostial DX to stenosis& tandem proximal-mid LAD stenosis.Well-preserved LV function.No significant right or circumflex disease.   CAROTID STENT  2007/2010   x3   COLONOSCOPY     Findings: Diverticulosis of sigmoid colon, mild internal hemorrhoids, otherwise unremarkable exam.   CORONARY ANGIOPLASTY WITH STENT PLACEMENT  09/23/2008   (Dr. Gwenlyn Found) Cath revealed patent stents with a 95% stenosis just beyond the distal edge of the mid LAD which was stented with a Xience V drug-eluting stent (3.0 x 12 mm). His remaining vessels were normal & EF was normal as well.  CORONARY ANGIOPLASTY WITH STENT PLACEMENT  12/19/2004   (Dr. Rollene Fare) Stents x 2 placed in his mid LAD.   KNEE ARTHROSCOPY Right 1997   Dr. Onnie Graham   MOLE REMOVAL  09/1999   Dr. Wilhemina Bonito. Aberrant mole removal of chest    UMBILICAL HERNIA REPAIR  10/19/2011   HERNIA REPAIR  UMBILICAL ADULT;  Surgeon: Imogene Burn. Georgette Dover, MD;  Location: [facility];  Service: General;  Laterality: N/A;  Umbilical hernia repair with mesh    Family History  Problem Relation Age of Onset   Heart disease Mother    Vascular Disease Mother 49   Cancer Mother        Breast cancer and pheochromocytoma    Heart disease Father    Heart attack Father     Social History:  reports that he has been smoking cigarettes and cigars. He has never used smokeless tobacco. He reports current alcohol use of about 10.0 standard drinks per week. He reports that he does not use drugs.   Medications Prior to Admission  Medication Sig Dispense Refill   acebutolol (SECTRAL) 200 MG capsule Take 200 mg by mouth 2 (two) times daily.      atorvastatin (LIPITOR) 20 MG tablet Take 20 mg by mouth daily.     clopidogrel (PLAVIX) 75 MG tablet Take 75 mg by mouth daily.      metroNIDAZOLE (METROCREAM) 0.75 % cream Apply 1 application topically daily.     Misc Natural Products (GLUCOSAMINE CHOND MSM FORMULA PO) Take 1 tablet by mouth daily.     Multiple Vitamins-Minerals (MULTIVITAMIN PO) Take 1 tablet by mouth daily.      nitroGLYCERIN (NITROSTAT) 0.4 MG SL tablet Place 0.4 mg under the tongue every 5 (five) minutes as needed for chest pain.     pantoprazole (PROTONIX) 40 MG tablet Take 40 mg by mouth daily.     tamsulosin (FLOMAX) 0.4 MG CAPS capsule Take 0.4 mg by mouth daily.     vitamin B-12 (CYANOCOBALAMIN) 1000 MCG tablet Take 1,000 mcg by mouth daily.       Physical Exam: Right shoulder demonstrates painful and profoundly restricted mobility is noted at recent office visits.  Strength is globally decreased secondary to pain and guarding.  Otherwise neurovascular intact in the right upper extremity.  Radiographs  Recent right shoulder plain films were reviewed showing severe osteoarthritis with complete obliteration of the joint space, subchondral sclerosis, and peripheral osteophyte formation with significant  posterior humeral head subluxation  Vitals  Temp:  [98.2 F (36.8 C)] 98.2 F (36.8 C) (01/26 0828) Pulse Rate:  [49-51] 49 (01/26 0918) Resp:  [13-18] 13 (01/26 0918) BP: (166-169)/(78-81) 169/81 (01/26 0913) SpO2:  [100 %] 100 % (01/26 0918)  Assessment/Plan  Impression: Right shoulder osteoarthritis,ROTATOR CUFF TEAR ARTHROPATHY  Plan of Action: Procedure(s): REVERSE SHOULDER ARTHROPLASTY  Finian Helvey M Buryl Bamber 06/15/2021, 9:22 AM Contact # [phone]

## 2021-06-15 NOTE — Op Note (Signed)
06/15/2021  11:51 AM  PATIENT:   Jonathan Bullock  81 y.o. male  PRE-OPERATIVE DIAGNOSIS: Right shoulder rotator cuff tear arthropathy  POST-OPERATIVE DIAGNOSIS: Same  PROCEDURE: Right shoulder reverse arthroplasty utilizing a press-fit size 10 Arthrex stem with a neutral metaphysis, +6 polyethylene insert, 42/+4 glenosphere and a small/+2 baseplate  SURGEON:  Ayeisha Lindenberger, Metta Clines M.D.  ASSISTANTS: Jenetta Loges, PA-C  ANESTHESIA:   General endotracheal and interscalene block with Exparel  EBL: 150 cc  SPECIMEN: None  Drains: None   PATIENT DISPOSITION:  PACU - hemodynamically stable.    PLAN OF CARE: Discharge to home after PACU  Brief history:  Jonathan Bullock is an 81 year old gentleman who has been followed for chronic and progressively increasing right shoulder pain related to severe osteoarthritis with marked bony deformity.  He has significant glenoid retroversion with subluxation.  Preoperative MRI scan also shows severe rotator cuff tendinosis with thinning and a full-thickness defect.  Due to his ongoing pain and functional rotations he is brought to the operating this time for planned right shoulder reverse arthroplasty.  Preoperatively, I counseled the patient regarding treatment options and risks versus benefits thereof.  Possible surgical complications were all reviewed including potential for bleeding, infection, neurovascular injury, persistent pain, loss of motion, anesthetic complication, failure of the implant, and possible need for additional surgery. They understand and accept and agrees with our planned procedure.   Procedure in detail:  After undergoing routine preop evaluation patient received prophylactic antibiotics and interscalene block with Exparel was established in the holding area by anesthesia department.  Subsequently placed spine on the operating table and underwent the smooth induction of the general endotracheal anesthesia which did require the use of a  glide scope.  Subsequently placed in the beachchair position and appropriately padded and protected.  The right shoulder duration was sterilely prepped and draped in standard fashion.  Timeout was called.  A deltopectoral approach to the right shoulder was made through an approximately 10 cm incision.  Skin flaps were elevated dissection carried deeply and the deltopectoral interval was developed from proximal to this with the vein taken laterally.  Conjoined tendon was mobilized and retracted medially and the adhesions were divided beneath the deltoid.  The long head bicep tendon was then tenodesed at the upper border the pectoralis major tendon and the proximal segment was unroofed and excised.  The rotator cuff was then split from the apex of the bicipital groove to the base of the coracoid and the subscap was then elevated from the lesser tuberosity utilizing electrocautery the free margin was then tagged with a pair of suture tape sutures.  Capsular attachments on the anterior and infra margins of the humeral neck were then divided in a subperiosteal fashion allowing deliver the head through the wound.  An extra medullary guide was then used to outline the proposed humeral head resection which we performed with an oscillating saw at approximate 20 degrees retroversion.  Peripheral osteophytes on the humeral neck were then removed with a rondure.  A metal cap was then placed over the cut proximal humeral surface we then exposed the glenoid with appropriate retractors.  A circumferential labral resection was then completed showing significant deformity with retroversion and a B2 glenoid.  A guidepin was then directed into the center of the glenoid and the glenoid was then reamed with the central followed by the peripheral reamer given the size of the glenoid we felt that a 42 gave [place] the best coverage and so we  reamed with a 42 peripheral reamer.  Rondure was then used to remove the bone debris and the margins of  the glenoid preparation was then completed with the central drill and tap.  A 30 mm lag screw was selected and the baseplate was assembled and was then inserted with excellent purchase and fixation with vancomycin powder having been applied to the threads of the lag screw.  All of the peripheral locking screws were then placed using standard technique with excellent fit and fixation.  A 42/+4 glenosphere was then impacted on the baseplate and the central locking screw was placed.  We returned our attention back to the proximal humerus where the canal was opened and we broached up to a size 10 at 20 degrees retroversion.  A neutral metaphyseal reaming guide was then utilized.  A trial implant was then placed and this showed good motion good stability good soft tissue balance.  Our trial was then removed.  The final implant was assembled.  The canal was cleaned and dried with vancomycin powder spread liberally into the canal.  The implant was then seated into the canal with excellent purchase and fixation.  Trial reduction was then once again performed and ultimately felt that a +6 probably gave [place] the best motion, stability, and soft tissue balance.  Our trial was removed the final +6 polywas then impacted on the implant as it was cleaned and dried.  Final reduction was then performed.  Once again we are very pleased with motion stability and soft tissue balance.  We then confirmed the subscapularis had appropriate elasticity and it was then repaired back to the eyelets on the collar of the implant using the previously placed suture tape sutures.  Final inspection irrigation was then completed.  Hemostasis was obtained.  The arm easily achieved 45 degrees of X rotation without excessive tension on the subscap repair.  The balance of the vancomycin powder was then spread liberally throughout the deep soft tissue planes.  The deltopectoral interval was reapproximated with a series of figure-of-eight and 1 Vicryl  sutures.  2-0 Monocryl used to the subcu layer and intracuticular 3-0 Monocryl for the skin followed by Dermabond and Aquacel dressing.  Right and was placed into a sling.  The patient was awakened, extubated, and taken to the recovery room in stable condition.  Jenetta Loges, PA-C was utilized as an Environmental consultant throughout this case, essential for help with positioning the patient, positioning extremity, tissue manipulation, implantation of the prosthesis, suture management, wound closure, and intraoperative decision-making.  Marin Shutter MD   Contact # [phone]

## 2021-06-15 NOTE — Anesthesia Preprocedure Evaluation (Addendum)
Anesthesia Evaluation  Patient identified by MRN, date of birth, ID band Patient awake    Reviewed: Allergy & Precautions, NPO status , Patient's Chart, lab work & pertinent test results  History of Anesthesia Complications Negative for: history of anesthetic complications  Airway Mallampati: III  TM Distance: >3 FB Neck ROM: Full    Dental  (+) Dental Advisory Given   Pulmonary Current Smoker and Patient abstained from smoking.,    Pulmonary exam normal        Cardiovascular hypertension, Pt. on medications and Pt. on home beta blockers + CAD and + Cardiac Stents   Rhythm:Regular Rate:Bradycardia     Neuro/Psych negative neurological ROS  negative psych ROS   GI/Hepatic Neg liver ROS, PUD, GERD  Medicated and Controlled,  Endo/Other  negative endocrine ROS  Renal/GU negative Renal ROS     Musculoskeletal negative musculoskeletal ROS (+)   Abdominal   Peds  Hematology  On plavix    Anesthesia Other Findings   Reproductive/Obstetrics                            Anesthesia Physical Anesthesia Plan  ASA: 3  Anesthesia Plan: General   Post-op Pain Management: Regional block and Tylenol PO (pre-op)   Induction: Intravenous  PONV Risk Score and Plan: 2 and Treatment may vary due to age or medical condition, Ondansetron and Dexamethasone  Airway Management Planned: Oral ETT  Additional Equipment: None  Intra-op Plan:   Post-operative Plan: Extubation in OR  Informed Consent: I have reviewed the patients History and Physical, chart, labs and discussed the procedure including the risks, benefits and alternatives for the proposed anesthesia with the patient or authorized representative who has indicated his/her understanding and acceptance.     Dental advisory given  Plan Discussed with: CRNA and Anesthesiologist  Anesthesia Plan Comments:        Anesthesia Quick  Evaluation

## 2021-06-23 ENCOUNTER — Encounter (HOSPITAL_COMMUNITY): Payer: Self-pay | Admitting: Orthopedic Surgery

## 2021-07-11 ENCOUNTER — Ambulatory Visit: Payer: Medicare Other | Admitting: Cardiovascular Disease

## 2021-08-07 ENCOUNTER — Emergency Department (HOSPITAL_BASED_OUTPATIENT_CLINIC_OR_DEPARTMENT_OTHER)
Admission: EM | Admit: 2021-08-07 | Discharge: 2021-08-07 | Disposition: A | Discharge status: Home / Self Care | Payer: Medicare Other | Attending: Emergency Medicine | Admitting: Emergency Medicine

## 2021-08-07 ENCOUNTER — Other Ambulatory Visit: Payer: Self-pay

## 2021-08-07 ENCOUNTER — Encounter (HOSPITAL_COMMUNITY): Payer: Self-pay

## 2021-08-07 ENCOUNTER — Ambulatory Visit (HOSPITAL_COMMUNITY)
Admission: EM | Admit: 2021-08-07 | Discharge: 2021-08-07 | Disposition: A | Discharge status: Home / Self Care | Payer: Medicare Other | Attending: Family Medicine | Admitting: Family Medicine

## 2021-08-07 ENCOUNTER — Encounter (HOSPITAL_BASED_OUTPATIENT_CLINIC_OR_DEPARTMENT_OTHER): Payer: Self-pay | Admitting: Emergency Medicine

## 2021-08-07 ENCOUNTER — Emergency Department (HOSPITAL_BASED_OUTPATIENT_CLINIC_OR_DEPARTMENT_OTHER): Payer: Medicare Other | Admitting: Radiology

## 2021-08-07 DIAGNOSIS — Y92410 Unspecified street and highway as the place of occurrence of the external cause: Secondary | ICD-10-CM | POA: Diagnosis not present

## 2021-08-07 DIAGNOSIS — S80212A Abrasion, left knee, initial encounter: Secondary | ICD-10-CM

## 2021-08-07 DIAGNOSIS — S59902A Unspecified injury of left elbow, initial encounter: Secondary | ICD-10-CM | POA: Diagnosis present

## 2021-08-07 DIAGNOSIS — Y9301 Activity, walking, marching and hiking: Secondary | ICD-10-CM | POA: Diagnosis not present

## 2021-08-07 DIAGNOSIS — W1839XA Other fall on same level, initial encounter: Secondary | ICD-10-CM | POA: Diagnosis not present

## 2021-08-07 DIAGNOSIS — S51012A Laceration without foreign body of left elbow, initial encounter: Secondary | ICD-10-CM

## 2021-08-07 DIAGNOSIS — Z7901 Long term (current) use of anticoagulants: Secondary | ICD-10-CM | POA: Insufficient documentation

## 2021-08-07 NOTE — ED Provider Notes (Signed)
Pt seen briefly in triage. Sustained a lac/abrasion to left elbow, left knee and left finger when missed a curb today.  ? ?Is on plavix. ? ?The abrasion on his lateral elbow is a full thickness abrasion. Most likely we could repair to some satisfaction here; we are experiencing 1 1/2-2 hour wait times at present. Discussed this with pt, and he prefers to proceed to one of the ERs with less wait time. They are going to proceed to [facility] most likely. ?  ?Barrett Henle, MD ?08/07/21 1438 ? ?

## 2021-08-07 NOTE — ED Provider Notes (Signed)
?[facility] ?Provider Note ? ? ?CSN: [id number] ?Arrival date & time: 08/07/21  1526 ? ?  ? ?History ? ?Chief Complaint  ?Patient presents with  ? Fall  ? ? ?Jonathan Bullock is a 81 y.o. male. ? ? ?Fall ? ? ?Patient presents to the ED for evaluation of an injury to his elbow and knee.  Patient was walking across the street when he ended up tripping and stumbling.  He ended up sustaining skin tears to his left elbow and left knee.  He denies any head injury.  No loss of consciousness.  Patient initially went to an urgent care to be evaluated.  I reviewed the notes from that visit and apparently they recommended he come to the ED as they had a 1-1/2-hour to 2-hour wait time at the urgent care. ? ?Home Medications ?Prior to Admission medications   ?Medication Sig Start Date End Date Taking? Authorizing Provider  ?acebutolol (SECTRAL) 200 MG capsule Take 200 mg by mouth 2 (two) times daily.  06/10/11   [provider]  ?atorvastatin (LIPITOR) 20 MG tablet Take 20 mg by mouth daily. 05/22/21   [provider]  ?clopidogrel (PLAVIX) 75 MG tablet Take 75 mg by mouth daily.  08/08/11   [provider]  ?metroNIDAZOLE (METROCREAM) 0.75 % cream Apply 1 application topically daily. 11/15/19   [provider]  ?Misc Natural Products ([employer] MSM FORMULA PO) Take 1 tablet by mouth daily.    [provider]  ?Multiple Vitamins-Minerals (MULTIVITAMIN PO) Take 1 tablet by mouth daily.     [provider]  ?nitroGLYCERIN (NITROSTAT) 0.4 MG SL tablet Place 0.4 mg under the tongue every 5 (five) minutes as needed for chest pain. 12/06/20   [provider]  ?ondansetron (ZOFRAN) 4 MG tablet Take 1 tablet (4 mg total) by mouth every 8 (eight) hours as needed for nausea or vomiting. 06/15/21   Shuford, Olivia Mackie, PA-C  ?oxyCODONE-acetaminophen (PERCOCET) 5-325 MG tablet Take 1 tablet by mouth every 4 (four) hours as needed (max 6 q). ?Patient not  taking: Reported on 08/07/2021 06/15/21   Shuford, Olivia Mackie, PA-C  ?pantoprazole (PROTONIX) 40 MG tablet Take 40 mg by mouth daily. 07/04/11   [provider]  ?tamsulosin (FLOMAX) 0.4 MG CAPS capsule Take 0.4 mg by mouth daily.    [provider]  ?tiZANidine (ZANAFLEX) 4 MG tablet Take 1 tablet (4 mg total) by mouth daily. ?Patient not taking: Reported on 08/07/2021 06/15/21 06/15/22  Shuford, Olivia Mackie, PA-C  ?vitamin B-12 (CYANOCOBALAMIN) 1000 MCG tablet Take 1,000 mcg by mouth daily.    [provider]  ?   ? ?Allergies    ?Bee venom and Tamsulosin hcl   ? ?Review of Systems   ?Review of Systems  ?Constitutional:  Negative for fever.  ? ?Physical Exam ?Updated Vital Signs ?BP (!) 167/80 (BP Location: Right Arm)   Pulse (!) 54   Temp (!) 97.2 ?F (36.2 ?C)   Resp 18   Ht 1.803 m ('5\' 11"'$ )   Wt 85.7 kg   SpO2 98%   BMI 26.36 kg/m?  ?Physical Exam ?Vitals and nursing note reviewed.  ?Constitutional:   ?   General: He is not in acute distress. ?   Appearance: He is well-developed.  ?HENT:  ?   Head: Normocephalic and atraumatic.  ?   Right Ear: External ear normal.  ?   Left Ear: External ear normal.  ?Eyes:  ?   General: No scleral icterus.    ?  Right eye: No discharge.     ?   Left eye: No discharge.  ?   Conjunctiva/sclera: Conjunctivae normal.  ?Neck:  ?   Trachea: No tracheal deviation.  ?Cardiovascular:  ?   Rate and Rhythm: Normal rate.  ?Pulmonary:  ?   Effort: Pulmonary effort is normal. No respiratory distress.  ?   Breath sounds: No stridor.  ?Abdominal:  ?   General: There is no distension.  ?Musculoskeletal:     ?   General: Tenderness present. No swelling or deformity.  ?   Cervical back: Neck supple.  ?   Comments: Approximately 8 cm skin tear lateral aspect left elbow, wound margins separated, hematoma noted underneath the skin abrasion over left patella  ?Skin: ?   General: Skin is warm and dry.  ?   Findings: No rash.  ?Neurological:  ?   Mental Status: He is alert.  ?    Cranial Nerves: Cranial nerve deficit: no gross deficits.  ? ? ?ED Results / Procedures / Treatments   ?Labs ?(all labs ordered are listed, but only abnormal results are displayed) ?Labs Reviewed - No data to display ? ?EKG ?None ? ?Radiology ?DG Elbow 2 Views Left ? ?Result Date: 08/07/2021 ?CLINICAL DATA:  Fall. EXAM: LEFT ELBOW - 2 VIEW COMPARISON:  None. FINDINGS: Limited by single lateral view. There is no evidence of fracture, dislocation, or joint effusion. There is no evidence of arthropathy or other focal bone abnormality. Soft tissues are unremarkable. IMPRESSION: Limited by single lateral view. No definite acute fracture or dislocation. Electronically Signed   By: Ronney Asters M.D.   On: 08/07/2021 16:48  ? ?DG Knee Complete 4 Views Left ? ?Result Date: 08/07/2021 ?CLINICAL DATA:  Fall. EXAM: LEFT KNEE - COMPLETE 4+ VIEW COMPARISON:  None. FINDINGS: No evidence of fracture, dislocation, or joint effusion. No evidence of arthropathy or other focal bone abnormality. Soft tissues are unremarkable. IMPRESSION: Negative. Electronically Signed   By: Ronney Asters M.D.   On: 08/07/2021 16:49   ? ?Procedures ?Marland Kitchen.Laceration Repair ? ?Date/Time: 08/07/2021 6:47 PM ?Performed by: Dorie Rank, MD ?Authorized by: Dorie Rank, MD  ? ?Consent:  ?  Consent obtained:  Verbal ?  Consent given by:  Patient ?  Risks discussed:  Infection, need for additional repair, pain, poor cosmetic result and poor wound healing ?  Alternatives discussed:  No treatment and delayed treatment ?Universal protocol:  ?  Procedure explained and questions answered to patient or proxy's satisfaction: yes   ?  Relevant documents present and verified: yes   ?  Test results available: yes   ?  Imaging studies available: yes   ?  Required blood products, implants, devices, and special equipment available: yes   ?  Site/side marked: yes   ?  Immediately prior to procedure, a time out was called: yes   ?  Patient identity confirmed:  Verbally with  patient ?Anesthesia:  ?  Anesthesia method:  None ?Laceration details:  ?  Location: Left elbow. ?  Length (cm):  8 ?Exploration:  ?  Wound extent: no fascia violation noted, no foreign bodies/material noted, no muscle damage noted, no underlying fracture noted and no vascular damage noted   ?  Contaminated: no   ?Treatment:  ?  Area cleansed with:  Saline ?  Amount of cleaning:  Standard ?Comments:  ?   Superficial skin wound edges reapproximated, antibiotic impregnated gauze placed over the wound margin then covered with sterile 4 x 4's and  Coban dressing  ? ? ?Medications Ordered in ED ?Medications - No data to display ? ?ED Course/ Medical Decision Making/ A&P ?  ?                        ?Medical Decision Making ?Amount and/or Complexity of Data Reviewed ?Radiology: ordered. ? ? ?Patient was entered to the ER for evaluation after a fall.  X-rays of the elbow and knee performed.  Images and radiology report reviewed.  No signs of acute fracture.  Patient had a superficial skin tear not amenable to suturing.  Wound margins reapproximated, antibiotic biotic gauze and sterile dressing applied. ? ? ? ? ? ? ? ?Final Clinical Impression(s) / ED Diagnoses ?Final diagnoses:  ?Skin tear of left elbow without complication, initial encounter  ?Abrasion of left knee, initial encounter  ? ? ?Rx / DC Orders ?ED Discharge Orders   ? ? None  ? ?  ? ? ?  ?Dorie Rank, MD ?08/07/21 1849 ? ?

## 2021-08-07 NOTE — ED Triage Notes (Signed)
Pt presents with a left elbow skin tear, and left knee skin tear after falling while crossing the street today. ?

## 2021-08-07 NOTE — Discharge Instructions (Signed)
Monitor for signs of infection.   The wounds will take several weeks to heal.  Follow-up with your primary care doctor to be rechecked. ?

## 2021-08-07 NOTE — ED Triage Notes (Signed)
Pt arrives to ED from UC with c/o fall. Pt reports he had a mechanical fall while walking across the street today. He reports he has skin tears to left elbow and left knee. He denies LOC, dizziness, lightheadedness. No injury to head or neck.  ?

## 2023-03-11 ENCOUNTER — Encounter: Payer: Self-pay | Admitting: Cardiovascular Disease

## 2023-03-11 ENCOUNTER — Ambulatory Visit: Payer: Medicare Other | Attending: Cardiovascular Disease | Admitting: Cardiovascular Disease

## 2023-03-11 VITALS — BP 128/78 | HR 59 | Ht 71.0 in | Wt 203.0 lb

## 2023-03-11 DIAGNOSIS — I2510 Atherosclerotic heart disease of native coronary artery without angina pectoris: Secondary | ICD-10-CM | POA: Insufficient documentation

## 2023-03-11 DIAGNOSIS — I2583 Coronary atherosclerosis due to lipid rich plaque: Secondary | ICD-10-CM | POA: Insufficient documentation

## 2023-03-11 DIAGNOSIS — I10 Essential (primary) hypertension: Secondary | ICD-10-CM | POA: Diagnosis not present

## 2023-03-11 DIAGNOSIS — E782 Mixed hyperlipidemia: Secondary | ICD-10-CM | POA: Insufficient documentation

## 2023-03-11 NOTE — Progress Notes (Signed)
03/11/2023 ORRIE Bullock   10/25/1940  [id number]  Primary Physician Merri Brunette, MD Primary Cardiologist: Runell Gess MD FACP, [place], [place], [place]  HPI:  Jonathan Bullock is a 82 y.o.  mildly overweight, married Caucasian male, father of 2, grandfather to 5 grandchildren who I last saw in the office 02/23/2015.  His risk factors include hypertension and hyperlipidemia. He had stents placed to his mid LAD by Dr. Alanda Amass many years ago and had chest pain leading to a Myoview that showed inferior and anterior ischemia. I catheterized him at [facility] Sep 23, 2008, revealing patent stents with a 95% stenosis just beyond the distal edge of the mid LAD which I stented with a Xience V drug-eluting stent (3.0 x 12 mm.) His remaining vessels were normal and his EF was normal as well. He denied chest pain or shortness of breath. Dr. Renne Crigler follows his lipid profile. He apparently needs a ventral hernia repair by Dr. Raynald Kemp. A Myoview stress test performed in May 2009 was entirely norma.  He did have a Myoview stress test performed 09/27/11 which was normal. Dr. Renne Crigler follows his lipid profile.   Since I saw him 8 years ago he was seeing a cardiologist in [place].  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  He also had 2 shoulder replacements.  He is fairly active walking 2 to 2-1/2 miles a day.  He is completely asymptomatic.  Current Meds  Medication Sig   acebutolol (SECTRAL) 200 MG capsule Take 200 mg by mouth 2 (two) times daily.    atorvastatin (LIPITOR) 20 MG tablet Take 20 mg by mouth daily.   clopidogrel (PLAVIX) 75 MG tablet Take 75 mg by mouth daily.    metroNIDAZOLE (METROCREAM) 0.75 % cream Apply 1 application topically daily.   Misc Natural Products (GLUCOSAMINE CHOND MSM FORMULA PO) Take 1 tablet by mouth daily.   Multiple Vitamins-Minerals (MULTIVITAMIN PO) Take 1 tablet by mouth daily.    nitroGLYCERIN (NITROSTAT) 0.4 MG SL tablet Place  0.4 mg under the tongue every 5 (five) minutes as needed for chest pain.   pantoprazole (PROTONIX) 40 MG tablet Take 40 mg by mouth daily.   tamsulosin (FLOMAX) 0.4 MG CAPS capsule Take 0.4 mg by mouth daily.   vitamin B-12 (CYANOCOBALAMIN) 1000 MCG tablet Take 1,000 mcg by mouth daily.     Allergies  Allergen Reactions   Bee Venom Other (See Comments)   Tamsulosin Hcl Other (See Comments)    Social History   Socioeconomic History   Marital status: Married    Spouse name: Not on file   Number of children: Not on file   Years of education: Not on file   Highest education level: Not on file  Occupational History   Not on file  Tobacco Use   Smoking status: Some Days    Types: Cigarettes, Cigars   Smokeless tobacco: Never   Tobacco comments:    4 cigars/week  Substance and Sexual Activity   Alcohol use: Yes    Alcohol/week: 10.0 standard drinks of alcohol    Types: 10 drink(s) per week    Comment: 1 oz per day during week; 2oz/day on weekend   Drug use: No   Sexual activity: Not on file    Comment: 4 cigars a day  Other Topics Concern   Not on file  Social History Narrative   Not on file   Social Determinants of Health  Financial Resource Strain: Not on file  Food Insecurity: Not on file  Transportation Needs: Not on file  Physical Activity: Not on file  Stress: Not on file  Social Connections: Not on file  Intimate Partner Violence: Not on file     Review of Systems: General: negative for chills, fever, night sweats or weight changes.  Cardiovascular: negative for chest pain, dyspnea on exertion, edema, orthopnea, palpitations, paroxysmal nocturnal dyspnea or shortness of breath Dermatological: negative for rash Respiratory: negative for cough or wheezing Urologic: negative for hematuria Abdominal: negative for nausea, vomiting, diarrhea, bright red blood per rectum, melena, or hematemesis Neurologic: negative for visual changes, syncope, or dizziness All  other systems reviewed and are otherwise negative except as noted above.    Blood pressure 128/78, pulse (!) 59, height 5\' 11"  (1.803 m), weight 203 lb (92.1 kg).  General appearance: alert and no distress Neck: no adenopathy, no carotid bruit, no JVD, supple, symmetrical, trachea midline, and thyroid not enlarged, symmetric, no tenderness/mass/nodules Lungs: clear to auscultation bilaterally Heart: regular rate and rhythm, S1, S2 normal, no murmur, click, rub or gallop Extremities: extremities normal, atraumatic, no cyanosis or edema Pulses: 2+ and symmetric Skin: Skin color, texture, turgor normal. No rashes or lesions Neurologic: Grossly normal  EKG EKG Interpretation Date/Time:  Monday March 11 2023 13:53:42 EDT Ventricular Rate:  59 PR Interval:  192 QRS Duration:  74 QT Interval:  418 QTC Calculation: 413 R Axis:   -1  Text Interpretation: Sinus bradycardia with sinus arrhythmia Minimal voltage criteria for LVH, may be normal variant ( R in aVL ) When compared with ECG of 19-Oct-2011 06:28, No significant change was found Confirmed by Nanetta Batty [id number]) on 03/11/2023 2:10:37 PM    ASSESSMENT AND PLAN:   Coronary artery disease History of CAD status post remote LAD stenting by Dr. Alanda Amass.  I did perform cardiac catheterization on him 09/23/2008 revealing a patent LAD stent with 95% stenosis just beyond the distal edge of the mid LAD which I stented with a Xience 5 drug-eluting stent (3 mm x 12 mm).  The remaining vessels were normal as was his ejection fraction.  He denies chest pain or shortness of breath.  Essential hypertension History of essential hypertension with blood pressure measured today at 128/78.  He is on Sectral.  Hyperlipidemia History of hyperlipidemia on statin therapy with lipid profile performed 05/08/2022 revealing total cholesterol 140, LDL 73 and HDL of 50.     Runell Gess MD FACP,FACC,FAHA, [facility] 03/11/2023 2:25 PM

## 2023-03-11 NOTE — Assessment & Plan Note (Signed)
History of CAD status post remote LAD stenting by Dr. Alanda Amass.  I did perform cardiac catheterization on him 09/23/2008 revealing a patent LAD stent with 95% stenosis just beyond the distal edge of the mid LAD which I stented with a Xience 5 drug-eluting stent (3 mm x 12 mm).  The remaining vessels were normal as was his ejection fraction.  He denies chest pain or shortness of breath.

## 2023-03-11 NOTE — Assessment & Plan Note (Signed)
History of essential hypertension with blood pressure measured today at 128/78.  He is on Sectral.

## 2023-03-11 NOTE — Patient Instructions (Signed)
Medication Instructions:  Your physician recommends that you continue on your current medications as directed. Please refer to the Current Medication list given to you today.  *If you need a refill on your cardiac medications before your next appointment, please call your pharmacy*  Follow-Up: At [facility], you and your health needs are our priority.  As part of our continuing mission to provide you with exceptional heart care, we have created designated Provider Care Teams.  These Care Teams include your primary Cardiologist (physician) and Advanced Practice Providers (APPs -  Physician Assistants and Nurse Practitioners) who all work together to provide you with the care you need, when you need it.  Your next appointment:   12 month(s)  Provider:   Dr. Nanetta Batty

## 2023-03-11 NOTE — Assessment & Plan Note (Addendum)
History of hyperlipidemia on statin therapy with lipid profile performed 05/08/2022 revealing total cholesterol 140, LDL 73 and HDL of 50.

## 2023-10-28 ENCOUNTER — Other Ambulatory Visit (HOSPITAL_COMMUNITY): Payer: Self-pay | Admitting: Internal Medicine

## 2023-10-28 DIAGNOSIS — K219 Gastro-esophageal reflux disease without esophagitis: Secondary | ICD-10-CM

## 2023-11-25 ENCOUNTER — Ambulatory Visit (HOSPITAL_COMMUNITY)
Admission: RE | Admit: 2023-11-25 | Discharge: 2023-11-25 | Disposition: A | Discharge status: Home / Self Care | Payer: Self-pay | Source: Ambulatory Visit | Attending: Internal Medicine | Admitting: Internal Medicine

## 2023-11-25 DIAGNOSIS — K219 Gastro-esophageal reflux disease without esophagitis: Secondary | ICD-10-CM | POA: Insufficient documentation
